# Patient Record
Sex: Female | Born: 1960 | Race: White | Hispanic: No | Marital: Married | Smoking: Former smoker
Health system: Southern US, Community
[De-identification: ages and names within clinical notes are randomized; demographics above are authoritative.]

## PROBLEM LIST (undated history)

## (undated) DIAGNOSIS — M1990 Unspecified osteoarthritis, unspecified site: Secondary | ICD-10-CM

## (undated) DIAGNOSIS — C4490 Unspecified malignant neoplasm of skin, unspecified: Secondary | ICD-10-CM

## (undated) HISTORY — DX: Unspecified malignant neoplasm of skin, unspecified: C44.90

## (undated) HISTORY — PX: ABDOMINAL HYSTERECTOMY: SHX81

## (undated) HISTORY — PX: SKIN CANCER EXCISION: SHX779

## (undated) HISTORY — PX: TONSILLECTOMY: SUR1361

## (undated) HISTORY — DX: Unspecified osteoarthritis, unspecified site: M19.90

## (undated) HISTORY — PX: COLONOSCOPY: SHX174

---

## 2003-03-14 ENCOUNTER — Inpatient Hospital Stay (HOSPITAL_COMMUNITY): Admission: RE | Admit: 2003-03-14 | Discharge: 2003-03-15 | Payer: Self-pay | Admitting: Obstetrics and Gynecology

## 2012-03-30 ENCOUNTER — Encounter: Payer: Self-pay | Admitting: Internal Medicine

## 2012-04-04 ENCOUNTER — Other Ambulatory Visit: Payer: Self-pay | Admitting: Obstetrics and Gynecology

## 2012-05-12 ENCOUNTER — Encounter: Payer: Self-pay | Admitting: Internal Medicine

## 2012-05-12 ENCOUNTER — Ambulatory Visit (AMBULATORY_SURGERY_CENTER): Payer: BC Managed Care – PPO

## 2012-05-12 VITALS — Ht 62.0 in | Wt 174.2 lb

## 2012-05-12 DIAGNOSIS — Z1211 Encounter for screening for malignant neoplasm of colon: Secondary | ICD-10-CM

## 2012-05-12 DIAGNOSIS — Z800 Family history of malignant neoplasm of digestive organs: Secondary | ICD-10-CM

## 2012-05-12 MED ORDER — NA SULFATE-K SULFATE-MG SULF 17.5-3.13-1.6 GM/177ML PO SOLN: 1.0000 | Freq: Once | ORAL | Status: DC

## 2012-05-19 ENCOUNTER — Encounter: Payer: Self-pay | Admitting: Internal Medicine

## 2012-05-26 ENCOUNTER — Encounter: Payer: Self-pay | Admitting: Internal Medicine

## 2012-05-26 ENCOUNTER — Ambulatory Visit (AMBULATORY_SURGERY_CENTER): Payer: BC Managed Care – PPO | Admitting: Internal Medicine

## 2012-05-26 VITALS — BP 117/71 | HR 58 | Temp 97.1°F | Resp 19 | Ht 62.0 in | Wt 174.0 lb

## 2012-05-26 DIAGNOSIS — Z1211 Encounter for screening for malignant neoplasm of colon: Secondary | ICD-10-CM

## 2012-05-26 MED ORDER — SODIUM CHLORIDE 0.9 % IV SOLN: 500.0000 mL | INTRAVENOUS | Status: DC

## 2012-05-26 NOTE — Op Note (Signed)
 [facility] [address] [place], [address]   COLONOSCOPY PROCEDURE REPORT  PATIENT: Katrina Palmer, Katrina Palmer  MR#: [id number] BIRTHDATE: 06/25/1960 , 51  yrs. old GENDER: Female ENDOSCOPIST: Iva Boop, MD, [facility] REFERRED XB:JYNWGN Ross, M.D. PROCEDURE DATE:  05/26/2012 PROCEDURE:   Colonoscopy, screening ASA CLASS:   Class I INDICATIONS:average risk screening. MEDICATIONS: propofol (Diprivan) 200mg  IV  DESCRIPTION OF PROCEDURE:   After the risks benefits and alternatives of the procedure were thoroughly explained, informed consent was obtained.  A digital rectal exam revealed no abnormalities of the rectum.   The LB CF-[id number]  endoscope was introduced through the anus and advanced to the cecum, which was identified by both the appendix and ileocecal valve. No adverse events experienced.   The quality of the prep was Suprep excellent The instrument was then slowly withdrawn as the colon was fully examined.      COLON FINDINGS: A normal appearing cecum, ileocecal valve, and appendiceal orifice were identified.  The ascending, hepatic flexure, transverse, splenic flexure, descending, sigmoid colon and rectum appeared unremarkable.  No polyps or cancers were seen.   A right colon retroflexion was performed.  Retroflexed views revealed no abnormalities. The time to cecum=2 minutes 10 seconds. Withdrawal time=7 minutes 46 seconds.  The scope was withdrawn and the procedure completed. COMPLICATIONS: There were no complications.  ENDOSCOPIC IMPRESSION: Normal colonoscopy, excellent prep  RECOMMENDATIONS: Repeat Colonscopy in 10 years.   eSigned:  Iva Boop, MD, [facility] 05/26/2012 10:13 AM   cc: The Patient  and Waynard Reeds, MD

## 2012-05-26 NOTE — Progress Notes (Addendum)
Patient did not have preoperative order for IV antibiotic SSI prophylaxis. (G8918)  Patient did not experience any of the following events: a burn prior to discharge; a fall within the facility; wrong site/side/patient/procedure/implant event; or a hospital transfer or hospital admission upon discharge from the facility. (G8907)  

## 2012-05-26 NOTE — Patient Instructions (Addendum)
The colonoscopy was normal and the prep was great!  Next routine colonoscopy in 1o years - 2024.  Thank you for choosing me and [facility].  Iva Boop, MD, FACG  YOU HAD AN ENDOSCOPIC PROCEDURE TODAY AT THE [facility]: Refer to the procedure report that was given to you for any specific questions about what was found during the examination.  If the procedure report does not answer your questions, please call your gastroenterologist to clarify.  If you requested that your care partner not be given the details of your procedure findings, then the procedure report has been included in a sealed envelope for you to review at your convenience later.  YOU SHOULD EXPECT: Some feelings of bloating in the abdomen. Passage of more gas than usual.  Walking can help get rid of the air that was put into your GI tract during the procedure and reduce the bloating. If you had a lower endoscopy (such as a colonoscopy or flexible sigmoidoscopy) you may notice spotting of blood in your stool or on the toilet paper. If you underwent a bowel prep for your procedure, then you may not have a normal bowel movement for a few days.  DIET: Your first meal following the procedure should be a light meal and then it is ok to progress to your normal diet.  A half-sandwich or bowl of soup is an example of a good first meal.  Heavy or fried foods are harder to digest and may make you feel nauseous or bloated.  Likewise meals heavy in dairy and vegetables can cause extra gas to form and this can also increase the bloating.  Drink plenty of fluids but you should avoid alcoholic beverages for 24 hours.  ACTIVITY: Your care partner should take you home directly after the procedure.  You should plan to take it easy, moving slowly for the rest of the day.  You can resume normal activity the day after the procedure however you should NOT DRIVE or use heavy machinery for 24 hours (because of the sedation  medicines used during the test).    SYMPTOMS TO REPORT IMMEDIATELY: A gastroenterologist can be reached at any hour.  During normal business hours, 8:30 AM to 5:00 PM Monday through Friday, call [phone].  After hours and on weekends, please call the GI answering service at [phone] who will take a message and have the physician on call contact you.   Following lower endoscopy (colonoscopy or flexible sigmoidoscopy):  Excessive amounts of blood in the stool  Significant tenderness or worsening of abdominal pains  Swelling of the abdomen that is new, acute  Fever of 100F or higher FOLLOW UP: If any biopsies were taken you will be contacted by phone or by letter within the next 1-3 weeks.  Call your gastroenterologist if you have not heard about the biopsies in 3 weeks.  Our staff will call the home number listed on your records the next business day following your procedure to check on you and address any questions or concerns that you may have at that time regarding the information given to you following your procedure. This is a courtesy call and so if there is no answer at the home number and we have not heard from you through the emergency physician on call, we will assume that you have returned to your regular daily activities without incident.  SIGNATURES/CONFIDENTIALITY: You and/or your care partner have signed paperwork which will be entered into your electronic  medical record.  These signatures attest to the fact that that the information above on your After Visit Summary has been reviewed and is understood.  Full responsibility of the confidentiality of this discharge information lies with you and/or your care-partner.

## 2012-05-27 ENCOUNTER — Telehealth: Payer: Self-pay

## 2012-05-27 NOTE — Telephone Encounter (Signed)
Unable to leave message patient away from desk.

## 2013-04-28 ENCOUNTER — Encounter: Payer: BC Managed Care – PPO | Admitting: Cardiology

## 2013-05-02 ENCOUNTER — Ambulatory Visit
Admission: RE | Admit: 2013-05-02 | Discharge: 2013-05-02 | Disposition: A | Discharge status: Home / Self Care | Payer: BC Managed Care – PPO | Source: Ambulatory Visit | Attending: Family Medicine | Admitting: Family Medicine

## 2013-05-02 ENCOUNTER — Other Ambulatory Visit: Payer: Self-pay | Admitting: Family Medicine

## 2013-05-02 DIAGNOSIS — G540 Brachial plexus disorders: Secondary | ICD-10-CM

## 2013-05-12 ENCOUNTER — Encounter: Payer: Self-pay | Admitting: Cardiology

## 2013-06-08 ENCOUNTER — Encounter: Payer: Self-pay | Admitting: Internal Medicine

## 2013-06-08 ENCOUNTER — Ambulatory Visit (INDEPENDENT_AMBULATORY_CARE_PROVIDER_SITE_OTHER): Payer: BC Managed Care – PPO | Admitting: Internal Medicine

## 2013-06-08 VITALS — BP 120/62 | HR 66 | Ht 62.0 in | Wt 166.7 lb

## 2013-06-08 DIAGNOSIS — M79603 Pain in arm, unspecified: Secondary | ICD-10-CM

## 2013-06-08 DIAGNOSIS — R202 Paresthesia of skin: Secondary | ICD-10-CM

## 2013-06-08 DIAGNOSIS — R0789 Other chest pain: Secondary | ICD-10-CM | POA: Insufficient documentation

## 2013-06-08 DIAGNOSIS — M79609 Pain in unspecified limb: Secondary | ICD-10-CM

## 2013-06-08 DIAGNOSIS — E785 Hyperlipidemia, unspecified: Secondary | ICD-10-CM

## 2013-06-08 DIAGNOSIS — R200 Anesthesia of skin: Secondary | ICD-10-CM | POA: Insufficient documentation

## 2013-06-08 DIAGNOSIS — R079 Chest pain, unspecified: Secondary | ICD-10-CM

## 2013-06-08 DIAGNOSIS — R209 Unspecified disturbances of skin sensation: Secondary | ICD-10-CM

## 2013-06-08 NOTE — Progress Notes (Signed)
OFFICE NOTE  Chief Complaint:  Arm numbness and tingling, chest tightness  Primary Care Physician: Katrina Roers, MD  HPI:  Katrina Palmer is a pleasant 53 year old female who is coming referred to me by Dr. Rex Palmer for evaluation of bilateral arm numbness and tingling. She reports her symptoms have been coming on gradually and are somewhat worse in the morning and when doing exercises or activities where she has trace her arms above her head. She does report some decreased grip strength in her hands, which she noted while water skiing. She is somewhat physically active and does exercise classes during the week. She does do some treadmill and aerobic exercises but not of very high metabolic activity. There is a family history of heart disease in her father. She was recently placed on low-dose cholesterol medication for a dyslipidemia. She had genetic testing done as well and I reviewed that today. In fact, her cholesterol profile actually looks fairly good. Her total cholesterol is 200 and direct LDL was 112. HDL was 74 and the HDL ratio was 3. Triglycerides were 85. CRP is low at 0.3 and homocystine is normal. Lipoprotein a was elevated at 60, speaking of a genetic component. Otherwise studies were normal. 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  According to her primary care office visit notes, she was noted to have a diminished pulse when holding her arms over her head. 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 She denies any prior trauma to her neck however has been doing exercises for which she's been carrying weights on her neck and back.  She's never been a significant smoker.  PMHx:  Past Medical History  Diagnosis Date  . Skin cancer     on legs  . Skin cancer     left leg    Past Surgical History  Procedure Laterality Date  . Abdominal hysterectomy    . Tonsillectomy    .  Skin cancer excision      removed from left leg    FAMHx:  Family History  Problem Relation Age of Onset  . Angina Mother   . Heart disease Father   . Diabetes Father   . Stroke Father   . Hypertension Brother   . Colon cancer Maternal Grandmother     SOCHx:   reports that she has quit smoking. She has never used smokeless tobacco. She reports that she drinks about 1.2 ounces of alcohol per week. She reports that she does not use illicit drugs.  ALLERGIES:  No Known Allergies  ROS: A comprehensive review of systems was negative except for: Cardiovascular: positive for chest pressure/discomfort Neurological: positive for paresthesia and weakness  HOME MEDS: Current Outpatient Prescriptions  Medication Sig Dispense Refill  . omega-3 acid ethyl esters (LOVAZA) 1 G capsule Take 1 g by mouth daily.      . rosuvastatin (CRESTOR) 5 MG tablet Take 5 mg by mouth daily.       No current facility-administered medications for this visit.    LABS/IMAGING: No results found for this or any previous visit (from the past 48 hour(s)). No results found.  VITALS: BP 120/62  Pulse 66  Ht 5\' 2"  (1.575 m)  Wt 166 lb 11.2 oz (75.615 kg)  BMI 30.48 kg/m2  EXAM: General appearance: alert and no distress Neck: no carotid bruit and no JVD Lungs: clear to auscultation bilaterally  Heart: regular rate and rhythm, S1, S2 normal, no murmur, click, rub or gallop Abdomen: soft, non-tender; bowel sounds normal; no masses,  no organomegaly Extremities: extremities normal, atraumatic, no cyanosis or edema Pulses: 2+ and symmetric Diminished pulses were not noted with lifting her arms, she has equal blood pressures in both arms Skin: Skin color, texture, turgor normal. No rashes or lesions Neurologic: Alert and oriented X 3, normal strength and tone. Normal symmetric reflexes. Normal coordination and gait Sensory: normal, intact bilateral sensation in arms, negative Phalen's and Tinel's  tests Motor: grossly normal strength 5/5 bilaterally, equal grips Psych: Mood, affect normal  EKG: Normal sinus rhythm at 66  ASSESSMENT: 1. Bilateral arm numbness and tingling-suspect cervical radiculopathy 2. Atypical chest discomfort 3. Mild dyslipidemia  PLAN: 1.   Katrina Palmer is describing discomfort in her neck as well as numbness and tingling in her arms and transient loss of grip strength. Some of her symptoms are worse with extending her neck and reaching overhead. I suspected this represents some cervical radiculopathy. Her symptoms were somewhat worse with axial load she reports numbness and tingling down her left arm wearing her purse over her shoulder. I did not detect any subclavian or cervical bruits. She has equal pulses bilaterally and equal blood pressures bilaterally in her arms. She does report crepitus in the neck which speaks of probable arthritis. Nevertheless, we will check carotid Dopplers for completeness, but as she is not a smoker I think her risk is very low of subclavian or carotid stenosis. She is also describing some atypical chest discomfort which may be the cervical nerves in the upper chest. She did not have significant symptoms when exercising however she does not do very strenuous exercise. Given her family history a plain exercise treadmill stress test is reasonable. Finally, with regard to her lipid profile, I believe she is at very low risk and likely will not need Crestor. I suspect that with 15 pounds of weight loss and increased exercise her cholesterol profile looked pretty much normal.  Thank you as always for the kind referral. I'll be in contact with the results of her testing. If the tests come back negative, I would recommend a referral to a neuro spine specialist. I'm happy to place a referral for her.  Katrina Casino, MD, [facility] Attending Cardiologist [place] 06/08/2013, 9:35 AM

## 2013-06-08 NOTE — Patient Instructions (Signed)
Your physician has requested that you have an exercise tolerance test. For further information please visit [email or web address]. Please also follow instruction sheet, as given.  Your physician has requested that you have a carotid duplex. This test is an ultrasound of the carotid arteries in your neck. It looks at blood flow through these arteries that supply the brain with blood. Allow one hour for this exam. There are no restrictions or special instructions.  Your physician recommends that you schedule a follow-up appointment after your tests.

## 2013-06-22 ENCOUNTER — Telehealth (HOSPITAL_COMMUNITY): Payer: Self-pay

## 2013-06-27 ENCOUNTER — Other Ambulatory Visit: Payer: Self-pay

## 2013-06-27 ENCOUNTER — Ambulatory Visit (HOSPITAL_BASED_OUTPATIENT_CLINIC_OR_DEPARTMENT_OTHER)
Admission: RE | Admit: 2013-06-27 | Discharge: 2013-06-27 | Disposition: A | Discharge status: Home / Self Care | Payer: BC Managed Care – PPO | Source: Ambulatory Visit | Attending: Cardiovascular Disease | Admitting: Cardiovascular Disease

## 2013-06-27 ENCOUNTER — Ambulatory Visit (HOSPITAL_COMMUNITY)
Admission: RE | Admit: 2013-06-27 | Discharge: 2013-06-27 | Disposition: A | Discharge status: Home / Self Care | Payer: BC Managed Care – PPO | Source: Ambulatory Visit | Attending: Cardiovascular Disease | Admitting: Cardiovascular Disease

## 2013-06-27 DIAGNOSIS — R202 Paresthesia of skin: Secondary | ICD-10-CM

## 2013-06-27 DIAGNOSIS — R200 Anesthesia of skin: Secondary | ICD-10-CM

## 2013-06-27 DIAGNOSIS — M79603 Pain in arm, unspecified: Secondary | ICD-10-CM

## 2013-06-27 DIAGNOSIS — R0989 Other specified symptoms and signs involving the circulatory and respiratory systems: Secondary | ICD-10-CM

## 2013-06-27 DIAGNOSIS — R209 Unspecified disturbances of skin sensation: Secondary | ICD-10-CM | POA: Insufficient documentation

## 2013-06-27 DIAGNOSIS — R079 Chest pain, unspecified: Secondary | ICD-10-CM

## 2013-06-27 DIAGNOSIS — M79609 Pain in unspecified limb: Secondary | ICD-10-CM | POA: Insufficient documentation

## 2013-06-27 NOTE — Progress Notes (Signed)
Carotid Duplex Completed. Eartha Vonbehren, BS, RDMS, RVT  

## 2013-06-28 ENCOUNTER — Telehealth: Payer: Self-pay | Admitting: *Deleted

## 2013-06-28 NOTE — Telephone Encounter (Signed)
Left message with husband about normal exercise tolerance test results. Marlou Sa, husband, will communicate this with wife.

## 2013-07-04 NOTE — Progress Notes (Signed)
LMTCB

## 2013-07-05 ENCOUNTER — Telehealth: Payer: Self-pay | Admitting: *Deleted

## 2013-07-05 NOTE — Telephone Encounter (Signed)
Patient notified of carotid doppler results.  

## 2013-07-05 NOTE — Telephone Encounter (Signed)
Pt was calling in regards to her test results.  [place]

## 2013-07-18 ENCOUNTER — Encounter: Payer: Self-pay | Admitting: Internal Medicine

## 2013-07-18 ENCOUNTER — Ambulatory Visit (INDEPENDENT_AMBULATORY_CARE_PROVIDER_SITE_OTHER): Payer: BC Managed Care – PPO | Admitting: Internal Medicine

## 2013-07-18 VITALS — BP 116/73 | HR 58 | Ht 62.0 in | Wt 162.6 lb

## 2013-07-18 DIAGNOSIS — R202 Paresthesia of skin: Principal | ICD-10-CM

## 2013-07-18 DIAGNOSIS — R200 Anesthesia of skin: Secondary | ICD-10-CM

## 2013-07-18 DIAGNOSIS — E785 Hyperlipidemia, unspecified: Secondary | ICD-10-CM

## 2013-07-18 DIAGNOSIS — R0789 Other chest pain: Secondary | ICD-10-CM

## 2013-07-18 DIAGNOSIS — R209 Unspecified disturbances of skin sensation: Secondary | ICD-10-CM

## 2013-07-18 MED ORDER — ROSUVASTATIN CALCIUM 5 MG PO TABS
5.0000 mg | ORAL_TABLET | Freq: Every day | ORAL | Status: DC
Start: 2013-07-18 — End: 2015-10-02

## 2013-07-18 NOTE — Patient Instructions (Signed)
Your physician recommends that you schedule a follow-up appointment as needed  

## 2013-07-18 NOTE — Progress Notes (Signed)
OFFICE NOTE  Chief Complaint:  Arm numbness and tingling, chest tightness  Primary Care Physician: Katrina Roers, MD  HPI:  Katrina Palmer is a pleasant 53 year old female who is coming referred to me by Dr. Rex Palmer for evaluation of bilateral arm numbness and tingling. She reports her symptoms have been coming on gradually and are somewhat worse in the morning and when doing exercises or activities where she has trace her arms above her head. She does report some decreased grip strength in her hands, which she noted while water skiing. She is somewhat physically active and does exercise classes during the week. She does do some treadmill and aerobic exercises but not of very high metabolic activity. There is a family history of heart disease in her father. She was recently placed on low-dose cholesterol medication for a dyslipidemia. She had genetic testing done as well and I reviewed that today. In fact, her cholesterol profile actually looks fairly good. Her total cholesterol is 200 and direct LDL was 112. HDL was 74 and the HDL ratio was 3. Triglycerides were 85. CRP is low at 0.3 and homocystine is normal. Lipoprotein a was elevated at 60, speaking of a genetic component. Otherwise studies were normal. 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  According to her primary care office visit notes, she was noted to have a diminished pulse when holding her arms over her head. 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 She denies any prior trauma to her neck however has been doing exercises for which she's been carrying weights on her neck and back.  She's never been a significant smoker.  Katrina Palmer returns today for followup of her stress test and carotid Dopplers. The carotid Doppler did not demonstrate any carotid plaque. Her exercise treadmill stress test was negative for ischemia and she  exercised for over 12 minutes and 13 metabolic equivalents. Her Duke treadmill score is +12.  This is associated with a very favorable prognosis.  PMHx:  Past Medical History  Diagnosis Date  . Skin cancer     on legs  . Skin cancer     left leg    Past Surgical History  Procedure Laterality Date  . Abdominal hysterectomy    . Tonsillectomy    . Skin cancer excision      removed from left leg    FAMHx:  Family History  Problem Relation Age of Onset  . Angina Mother   . Heart disease Father   . Diabetes Father   . Stroke Father   . Hypertension Brother   . Colon cancer Maternal Grandmother     SOCHx:   reports that she has quit smoking. She has never used smokeless tobacco. She reports that she drinks about 1.2 ounces of alcohol per week. She reports that she does not use illicit drugs.  ALLERGIES:  No Known Allergies  ROS: A comprehensive review of systems was negative except for: Cardiovascular: positive for chest pressure/discomfort Neurological: positive for paresthesia and weakness  HOME MEDS: Current Outpatient Prescriptions  Medication Sig Dispense Refill  . omega-3 acid ethyl esters (LOVAZA) 1 G capsule Take 1 g by mouth daily.      . rosuvastatin (CRESTOR) 5 MG tablet Take 1 tablet (5 mg total) by mouth daily.  28 tablet  0   No current facility-administered medications for this visit.  LABS/IMAGING: No results found for this or any previous visit (from the past 48 hour(s)). No results found.  VITALS: BP 116/73  Pulse 58  Ht 5\' 2"  (1.575 m)  Wt 162 lb 9.6 oz (73.755 kg)  BMI 29.73 kg/m2  EXAM: deferred  EKG: deferred  ASSESSMENT: 1. Bilateral arm numbness and tingling-suspect cervical radiculopathy  2. Atypical chest discomfort - negative Bruce Treadmill stress test 3. Mild dyslipidemia  PLAN: 1.   [name] had a low-risk stress test with good exercise performance. This is highly predictive of low risk of cardiac events. Her carotid  Dopplers demonstrated no plaque. It is certainly reasonable to continue her on low-dose Crestor with a goal LDL of around 110. Overall her genetic markers were very favorable arguing against risk for developing atherosclerosis.  She continues to have some pains from her neck which could be a cervical radiculopathy. It may be worthwhile obtaining plain films of the neck or considering a spine evaluation. She is not interested in further workup at this time for that.  Thank you as always for the kind referral. She can follow up with me on an as-needed basis.  Pixie Casino, MD, [facility] Attending Cardiologist [place] 07/18/2013, 11:59 AM

## 2013-09-13 NOTE — Telephone Encounter (Signed)
Encounter complete. 

## 2014-08-24 ENCOUNTER — Ambulatory Visit: Payer: Self-pay | Admitting: Podiatry

## 2014-08-31 ENCOUNTER — Ambulatory Visit: Payer: Self-pay | Admitting: Podiatry

## 2015-02-18 LAB — HM PAP SMEAR: HM Pap smear: NEGATIVE

## 2015-08-01 DIAGNOSIS — C4490 Unspecified malignant neoplasm of skin, unspecified: Secondary | ICD-10-CM

## 2015-08-01 HISTORY — DX: Unspecified malignant neoplasm of skin, unspecified: C44.90

## 2015-09-02 ENCOUNTER — Telehealth: Payer: Self-pay | Admitting: Oncology

## 2015-09-02 NOTE — Telephone Encounter (Signed)
Pt appt to see Dr. Barry Dienes is 09/16/15 @ 9:15.  Pt is aware

## 2015-09-16 ENCOUNTER — Other Ambulatory Visit: Payer: Self-pay | Admitting: General Surgery

## 2015-09-16 DIAGNOSIS — C4359 Malignant melanoma of other part of trunk: Secondary | ICD-10-CM

## 2015-09-16 DIAGNOSIS — C439 Malignant melanoma of skin, unspecified: Secondary | ICD-10-CM

## 2015-10-07 ENCOUNTER — Encounter (HOSPITAL_COMMUNITY): Payer: Self-pay

## 2015-10-07 NOTE — Pre-Procedure Instructions (Signed)
Katrina Palmer  10/07/2015      [facility] - [place] - [address] [place] [address] Phone: [phone] Fax: [phone]  [place], [place] [address] Phone: [phone] Fax: [phone]    Your procedure is scheduled on Tuesday, August 15.  Report to [facility] Admitting at 6:30 A.M.  Call this number if you have problems the morning of surgery:  [phone]   Remember:  Do not eat food or drink liquids after midnight.  Take these medicines the morning of surgery with A SIP OF WATER: NONE  7 days prior to surgery STOP taking any Aspirin, Aleve, Naproxen, Ibuprofen, Motrin, Advil, Goody's, BC's, all herbal medications, fish oil, and all vitamins    Do not wear jewelry, make-up or nail polish.  Do not wear lotions, powders, or perfumes.  You may NOT wear deoderant.  Do not shave 48 hours prior to surgery.   Do not bring valuables to the hospital.  [facility] is not responsible for any belongings or valuables.  Contacts, dentures or bridgework may not be worn into surgery.  Leave your suitcase in the car.  After surgery it may be brought to your room.  For patients admitted to the hospital, discharge time will be determined by your treatment team.  Patients discharged the day of surgery will not be allowed to drive home.    Special instructions:     [facility]- Preparing For Surgery  Before surgery, you can play an important role. Because skin is not sterile, your skin needs to be as free of germs as possible. You can reduce the number of germs on your skin by washing with CHG (chlorahexidine gluconate) Soap before surgery.  CHG is an antiseptic cleaner which kills germs and bonds with the skin to continue killing germs even after washing.  Please do not use if you have an allergy to CHG or antibacterial  soaps. If your skin becomes reddened/irritated stop using the CHG.  Do not shave (including legs and underarms) for at least 48 hours prior to first CHG shower. It is OK to shave your face.  Please follow these instructions carefully.   1. Shower the NIGHT BEFORE SURGERY and the MORNING OF SURGERY with CHG.   2. If you chose to wash your hair, wash your hair first as usual with your normal shampoo.  3. After you shampoo, rinse your hair and body thoroughly to remove the shampoo.  4. Use CHG as you would any other liquid soap. You can apply CHG directly to the skin and wash gently with a scrungie or a clean washcloth.   5. Apply the CHG Soap to your body ONLY FROM THE NECK DOWN.  Do not use on open wounds or open sores. Avoid contact with your eyes, ears, mouth and genitals (private parts). Wash genitals (private parts) with your normal soap.  6. Wash thoroughly, paying special attention to the area where your surgery will be performed.  7. Thoroughly rinse your body with warm water from the neck down.  8. DO NOT shower/wash with your normal soap after using and rinsing off the CHG Soap.  9. Pat yourself dry with a CLEAN TOWEL.   10. Wear CLEAN PAJAMAS   11. Place CLEAN SHEETS on your bed the night of your first shower and DO NOT SLEEP WITH  PETS.    Day of Surgery: Do not apply any deodorants/lotions. Please wear clean clothes to the hospital/surgery center.      Please read over the following fact sheets that you were given. Pain Booklet, Coughing and Deep Breathing and Surgical Site Infection Prevention

## 2015-10-08 ENCOUNTER — Encounter (HOSPITAL_COMMUNITY): Payer: Self-pay

## 2015-10-08 ENCOUNTER — Encounter (HOSPITAL_COMMUNITY)
Admission: RE | Admit: 2015-10-08 | Discharge: 2015-10-08 | Disposition: A | Discharge status: Home / Self Care | Payer: BLUE CROSS/BLUE SHIELD | Source: Ambulatory Visit | Attending: General Surgery | Admitting: General Surgery

## 2015-10-08 ENCOUNTER — Ambulatory Visit (HOSPITAL_COMMUNITY)
Admission: RE | Admit: 2015-10-08 | Discharge: 2015-10-08 | Disposition: A | Discharge status: Home / Self Care | Payer: BLUE CROSS/BLUE SHIELD | Source: Ambulatory Visit | Attending: General Surgery | Admitting: General Surgery

## 2015-10-08 DIAGNOSIS — Z01818 Encounter for other preprocedural examination: Secondary | ICD-10-CM | POA: Insufficient documentation

## 2015-10-08 DIAGNOSIS — C4362 Malignant melanoma of left upper limb, including shoulder: Secondary | ICD-10-CM | POA: Diagnosis not present

## 2015-10-08 DIAGNOSIS — Z01812 Encounter for preprocedural laboratory examination: Secondary | ICD-10-CM | POA: Insufficient documentation

## 2015-10-08 LAB — CBC
HCT: 41.6 % (ref 36.0–46.0)
HEMOGLOBIN: 13.8 g/dL (ref 12.0–15.0)
MCH: 30.8 pg (ref 26.0–34.0)
MCHC: 33.2 g/dL (ref 30.0–36.0)
MCV: 92.9 fL (ref 78.0–100.0)
Platelets: 262 10*3/uL (ref 150–400)
RBC: 4.48 MIL/uL (ref 3.87–5.11)
RDW: 12.8 % (ref 11.5–15.5)
WBC: 5.2 10*3/uL (ref 4.0–10.5)

## 2015-10-08 LAB — BASIC METABOLIC PANEL
ANION GAP: 10 (ref 5–15)
BUN: 12 mg/dL (ref 6–20)
CALCIUM: 9.5 mg/dL (ref 8.9–10.3)
CO2: 24 mmol/L (ref 22–32)
CREATININE: 0.67 mg/dL (ref 0.44–1.00)
Chloride: 104 mmol/L (ref 101–111)
GFR calc Af Amer: >60 mL/min (ref >60)
GFR calc non Af Amer: >60 mL/min (ref >60)
GLUCOSE: 93 mg/dL (ref 65–99)
Potassium: 3.7 mmol/L (ref 3.5–5.1)
Sodium: 138 mmol/L (ref 135–145)

## 2015-10-08 MED ORDER — TECHNETIUM TC 99M SULFUR COLLOID FILTERED
0.5000 | Freq: Once | INTRAVENOUS | Status: AC | PRN
  Administered 2015-10-08: 0.5 via INTRADERMAL

## 2015-10-08 NOTE — Progress Notes (Signed)
PCP - Tamsen Roers Cardiologist - denies  Chest x-ray - not needed EKG - 2015  - EPIC Stress Test - 06/19/13 ECHO - denies Cardiac Cath - denies    Patient denies shortness of breath, fever, cough and chest pain at PAT appointment

## 2015-10-10 NOTE — H&P (Signed)
Katrina Palmer 09/16/2015 9:26 AM Location: [facility] Patient #: [id number] DOB: 12-09-1960 Married / Language: Cleophus Molt / Race: White Female   History of Present Illness Stark Klein MD; 09/16/2015 10:49 AM) The patient is a 55 year old female who presents for a melanoma biopsy follow-up. Patient is 55 year old female referred by Dr. Jarome Matin for consultation regarding a new malignant melanoma. The patient has a history of sun exposure and a history of basal cell cancers. She had a new pigmented lesion that was biopsied and was positive for superficial spreading malignant melanoma. This is at least 0.9 millimeters deep. Ulceration was absent. There were fewer than 1 mitoses per millimeter square. The lesion extended to peripheral and deep margins. Regression was absent and tumor infiltrating lymphocytes were present. LVI was negative. Patient has a positive family history of melanoma in her father.   Other Problems Elbert Ewings, CMA; 09/16/2015 9:27 AM) No pertinent past medical history  Past Surgical History Elbert Ewings, CMA; 09/16/2015 9:27 AM) Hysterectomy (not due to cancer) - Complete Oral Surgery Tonsillectomy  Diagnostic Studies History Elbert Ewings, CMA; 09/16/2015 9:27 AM) Colonoscopy 1-5 years ago Mammogram within last year Pap Smear 1-5 years ago  Allergies Elbert Ewings, CMA; 09/16/2015 9:27 AM) No Known Drug Allergies07/17/2017  Medication History Elbert Ewings, CMA; 09/16/2015 9:27 AM) No Current Medications Medications Reconciled  Social History Elbert Ewings, CMA; 09/16/2015 9:27 AM) Alcohol use Moderate alcohol use. Caffeine use Coffee. No drug use Tobacco use Former smoker.  Family History Elbert Ewings, [place]; 09/16/2015 9:27 AM) Alcohol Abuse Father. Diabetes Mellitus Father. Heart Disease Father. Heart disease in female family member before age 43 Melanoma Father. Thyroid problems Mother.  Pregnancy / Birth History  Elbert Ewings, CMA; 09/16/2015 9:27 AM) Age at menarche 47 years. Age of menopause <43 Contraceptive History Oral contraceptives. Gravida 5 Maternal age 60-30 Para 2    Review of Systems Elbert Ewings CMA; 09/16/2015 9:27 AM) General Present- Night Sweats. Not Present- Appetite Loss, Chills, Fatigue, Fever, Weight Gain and Weight Loss. Skin Present- Change in Wart/Mole. Not Present- Dryness, Hives, Jaundice, New Lesions, Non-Healing Wounds, Rash and Ulcer. HEENT Present- Seasonal Allergies. Not Present- Earache, Hearing Loss, Hoarseness, Nose Bleed, Oral Ulcers, Ringing in the Ears, Sinus Pain, Sore Throat, Visual Disturbances, Wears glasses/contact lenses and Yellow Eyes. Respiratory Not Present- Bloody sputum, Chronic Cough, Difficulty Breathing, Snoring and Wheezing. Breast Not Present- Breast Mass, Breast Pain, Nipple Discharge and Skin Changes. Cardiovascular Not Present- Chest Pain, Difficulty Breathing Lying Down, Leg Cramps, Palpitations, Rapid Heart Rate, Shortness of Breath and Swelling of Extremities. Gastrointestinal Not Present- Abdominal Pain, Bloating, Bloody Stool, Change in Bowel Habits, Chronic diarrhea, Constipation, Difficulty Swallowing, Excessive gas, Gets full quickly at meals, Hemorrhoids, Indigestion, Nausea, Rectal Pain and Vomiting. Female Genitourinary Present- Frequency and Urgency. Not Present- Nocturia, Painful Urination and Pelvic Pain. Musculoskeletal Present- Muscle Pain. Not Present- Back Pain, Joint Pain, Joint Stiffness, Muscle Weakness and Swelling of Extremities. Neurological Present- Numbness. Not Present- Decreased Memory, Fainting, Headaches, Seizures, Tingling, Tremor, Trouble walking and Weakness. Psychiatric Present- Change in Sleep Pattern. Not Present- Anxiety, Bipolar, Depression, Fearful and Frequent crying. Endocrine Present- Hot flashes. Not Present- Cold Intolerance, Excessive Hunger, Hair Changes, Heat Intolerance and New  Diabetes. Hematology Not Present- Blood Thinners, Easy Bruising, Excessive bleeding, Gland problems, HIV and Persistent Infections.  Vitals Elbert Ewings CMA; 09/16/2015 9:27 AM) 09/16/2015 9:27 AM Weight: 175 lb Height: 62in Body Surface Area: 1.81 m Body Mass Index: 32.01 kg/m  Temp.: 97.34F(Temporal)  Pulse: 80 (  Regular)  BP: 124/74 (Sitting, Left Arm, Standard)       Physical Exam Stark Klein MD; 09/16/2015 10:49 AM) General Mental Status-Alert. General Appearance-Consistent with stated age. Hydration-Well hydrated. Voice-Normal.  Integumentary Note: healed over shave site upper middle back. slightly to left.   Head and Neck Head-normocephalic, atraumatic with no lesions or palpable masses.  Eye Sclera/Conjunctiva - Bilateral-No scleral icterus.  Chest and Lung Exam Chest and lung exam reveals -quiet, even and easy respiratory effort with no use of accessory muscles. Inspection Chest Wall - Normal. Back - normal.  Breast - Did not examine.  Cardiovascular Cardiovascular examination reveals -normal pedal pulses bilaterally. Note: regular rate and rhythm  Abdomen Inspection-Inspection Normal. Palpation/Percussion Palpation and Percussion of the abdomen reveal - Soft, Non Tender, No Rebound tenderness, No Rigidity (guarding) and No hepatosplenomegaly.  Peripheral Vascular Upper Extremity Inspection - Bilateral - Normal - No Clubbing, No Cyanosis, No Edema, Pulses Intact. Lower Extremity Palpation - Edema - Bilateral - No edema.  Neurologic Neurologic evaluation reveals -alert and oriented x 3 with no impairment of recent or remote memory. Mental Status-Normal.  Musculoskeletal Global Assessment -Note: no gross deformities.  Normal Exam - Left-Upper Extremity Strength Normal and Lower Extremity Strength Normal. Normal Exam - Right-Upper Extremity Strength Normal and Lower Extremity Strength  Normal.  Lymphatic Head & Neck  General Head & Neck Lymphatics: Bilateral - Description - Normal. Axillary  General Axillary Region: Bilateral - Description - Normal. Tenderness - Non Tender.    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 Because of the positive deep margin, I will plan to do sentinel lymph node mapping and biopsy. I reviewed with local excision with advancement flap closure. I discussed the normal lymph node mapping and the process by which this occurs. I discussed risks of surgery including bleeding, infection, damage to adjacent structures, numbness, or risk of recurrence, risk of additional procedures or surgeries.  I discussed postop restrictions such as no swimming for 2 weeks. The patient does go to awake every weekend. I also discussed no lifting. I reviewed time off work.  The patient and her husband agree to proceed.  I discussed that we would need lymphoscintogram pre operatively in order to make sure we just needed to do biopsy in 1 axilla. Current Plans Pt Education - Melanoma: melanoma You are being scheduled for surgery - Our schedulers will call you.  You should hear from our office's scheduling department within 5 working days about the location, date, and time of surgery. We try to make accommodations for patient's preferences in scheduling surgery, but sometimes the OR schedule or the surgeon's schedule prevents [place] from making those accommodations.  If you have not heard from our office [phone]) in 5 working days, call the office and ask for your surgeon's nurse.  If you have other questions about your diagnosis, plan, or surgery, call the office and ask for your surgeon's nurse.

## 2015-10-14 MED ORDER — CEFAZOLIN SODIUM-DEXTROSE 2-4 GM/100ML-% IV SOLN
2.0000 g | INTRAVENOUS | Status: AC
  Administered 2015-10-15: 2 g via INTRAVENOUS
  Filled 2015-10-14: qty 100

## 2015-10-14 NOTE — Anesthesia Preprocedure Evaluation (Addendum)
Anesthesia Evaluation  Patient identified by MRN, date of birth, ID band Patient awake    History of Anesthesia Complications Negative for: history of anesthetic complications  Airway Mallampati: I  TM Distance: >3 FB Neck ROM: Full    Dental  (+) Teeth Intact, Dental Advisory Given   Pulmonary neg pulmonary ROS, former smoker,    breath sounds clear to auscultation       Cardiovascular negative cardio ROS   Rhythm:Regular Rate:Normal     Neuro/Psych negative neurological ROS     GI/Hepatic negative GI ROS, Neg liver ROS,   Endo/Other  negative endocrine ROS  Renal/GU negative Renal ROS     Musculoskeletal   Abdominal   Peds  Hematology negative hematology ROS (+)   Anesthesia Other Findings   Reproductive/Obstetrics                           Anesthesia Physical Anesthesia Plan  ASA: I  Anesthesia Plan: General   Post-op Pain Management:    Induction: Intravenous  Airway Management Planned: Oral ETT  Additional Equipment:   Intra-op Plan:   Post-operative Plan: Extubation in OR  Informed Consent: I have reviewed the patients History and Physical, chart, labs and discussed the procedure including the risks, benefits and alternatives for the proposed anesthesia with the patient or authorized representative who has indicated his/her understanding and acceptance.     Plan Discussed with: CRNA  Anesthesia Plan Comments:         Anesthesia Quick Evaluation

## 2015-10-15 ENCOUNTER — Ambulatory Visit (HOSPITAL_COMMUNITY)
Admission: RE | Admit: 2015-10-15 | Discharge: 2015-10-15 | Disposition: A | Discharge status: Home / Self Care | Payer: BLUE CROSS/BLUE SHIELD | Source: Ambulatory Visit | Attending: General Surgery | Admitting: General Surgery

## 2015-10-15 ENCOUNTER — Ambulatory Visit (HOSPITAL_COMMUNITY): Payer: BLUE CROSS/BLUE SHIELD | Admitting: Certified Registered"

## 2015-10-15 ENCOUNTER — Encounter (HOSPITAL_COMMUNITY): Payer: Self-pay | Admitting: Certified Registered Nurse Anesthetist

## 2015-10-15 ENCOUNTER — Encounter (HOSPITAL_COMMUNITY)
Admission: RE | Disposition: A | Discharge status: Home / Self Care | Payer: Self-pay | Source: Ambulatory Visit | Attending: General Surgery

## 2015-10-15 DIAGNOSIS — C4359 Malignant melanoma of other part of trunk: Secondary | ICD-10-CM | POA: Insufficient documentation

## 2015-10-15 DIAGNOSIS — Z90710 Acquired absence of both cervix and uterus: Secondary | ICD-10-CM | POA: Diagnosis not present

## 2015-10-15 DIAGNOSIS — Z87891 Personal history of nicotine dependence: Secondary | ICD-10-CM | POA: Diagnosis not present

## 2015-10-15 HISTORY — PX: SENTINEL NODE BIOPSY: SHX6608

## 2015-10-15 HISTORY — PX: MELANOMA EXCISION WITH SENTINEL LYMPH NODE BIOPSY: SHX5267

## 2015-10-15 SURGERY — MELANOMA EXCISION WITH SENTINEL LYMPH NODE BIOPSY
Anesthesia: General | Site: Breast

## 2015-10-15 MED ORDER — OXYCODONE HCL 5 MG PO TABS
5.0000 mg | ORAL_TABLET | ORAL | Status: DC | PRN
  Administered 2015-10-15: 5 mg via ORAL

## 2015-10-15 MED ORDER — OXYCODONE HCL 5 MG PO TABS: 5.0000 mg | ORAL_TABLET | Freq: Four times a day (QID) | ORAL | 0 refills | Status: DC | PRN

## 2015-10-15 MED ORDER — LIDOCAINE 2% (20 MG/ML) 5 ML SYRINGE
INTRAMUSCULAR | Status: AC
  Filled 2015-10-15: qty 10

## 2015-10-15 MED ORDER — FENTANYL CITRATE (PF) 100 MCG/2ML IJ SOLN
INTRAMUSCULAR | Status: DC | PRN
  Administered 2015-10-15: 100 ug via INTRAVENOUS
  Administered 2015-10-15: 25 ug via INTRAVENOUS
  Administered 2015-10-15: 50 ug via INTRAVENOUS

## 2015-10-15 MED ORDER — CHLORHEXIDINE GLUCONATE CLOTH 2 % EX PADS: 6.0000 | MEDICATED_PAD | Freq: Once | CUTANEOUS | Status: DC

## 2015-10-15 MED ORDER — SUCCINYLCHOLINE CHLORIDE 200 MG/10ML IV SOSY
PREFILLED_SYRINGE | INTRAVENOUS | Status: AC
  Filled 2015-10-15: qty 10

## 2015-10-15 MED ORDER — LIDOCAINE 2% (20 MG/ML) 5 ML SYRINGE
INTRAMUSCULAR | Status: AC
  Filled 2015-10-15: qty 5

## 2015-10-15 MED ORDER — ONDANSETRON HCL 4 MG/2ML IJ SOLN
INTRAMUSCULAR | Status: DC | PRN
  Administered 2015-10-15 (×2): 4 mg via INTRAVENOUS

## 2015-10-15 MED ORDER — MIDAZOLAM HCL 5 MG/5ML IJ SOLN
INTRAMUSCULAR | Status: DC | PRN
  Administered 2015-10-15: 2 mg via INTRAVENOUS

## 2015-10-15 MED ORDER — TECHNETIUM TC 99M SULFUR COLLOID FILTERED
0.5000 | Freq: Once | INTRAVENOUS | Status: AC | PRN
  Administered 2015-10-15: 0.5 via INTRADERMAL

## 2015-10-15 MED ORDER — LIDOCAINE HCL (PF) 1 % IJ SOLN
INTRAMUSCULAR | Status: AC
  Filled 2015-10-15: qty 30

## 2015-10-15 MED ORDER — LIDOCAINE 2% (20 MG/ML) 5 ML SYRINGE
INTRAMUSCULAR | Status: DC | PRN
  Administered 2015-10-15: 50 mg via INTRAVENOUS

## 2015-10-15 MED ORDER — ROCURONIUM BROMIDE 10 MG/ML (PF) SYRINGE
PREFILLED_SYRINGE | INTRAVENOUS | Status: DC | PRN
  Administered 2015-10-15: 10 mg via INTRAVENOUS
  Administered 2015-10-15: 50 mg via INTRAVENOUS

## 2015-10-15 MED ORDER — LIDOCAINE HCL 1 % IJ SOLN
INTRAMUSCULAR | Status: DC | PRN
  Administered 2015-10-15: 09:00:00

## 2015-10-15 MED ORDER — PROPOFOL 10 MG/ML IV BOLUS
INTRAVENOUS | Status: AC
Start: 2015-10-15 — End: 2015-10-15
  Filled 2015-10-15: qty 20

## 2015-10-15 MED ORDER — METHYLENE BLUE 0.5 % INJ SOLN
INTRAVENOUS | Status: DC | PRN
  Administered 2015-10-15: 10 mL via SUBMUCOSAL

## 2015-10-15 MED ORDER — EPHEDRINE SULFATE 50 MG/ML IJ SOLN
INTRAMUSCULAR | Status: DC | PRN
  Administered 2015-10-15: 5 mg via INTRAVENOUS

## 2015-10-15 MED ORDER — BUPIVACAINE-EPINEPHRINE (PF) 0.25% -1:200000 IJ SOLN
INTRAMUSCULAR | Status: AC
  Filled 2015-10-15: qty 30

## 2015-10-15 MED ORDER — HYDROMORPHONE HCL 1 MG/ML IJ SOLN: 0.2500 mg | INTRAMUSCULAR | Status: DC | PRN

## 2015-10-15 MED ORDER — ONDANSETRON HCL 4 MG/2ML IJ SOLN
INTRAMUSCULAR | Status: AC
  Filled 2015-10-15: qty 2

## 2015-10-15 MED ORDER — PHENYLEPHRINE 40 MCG/ML (10ML) SYRINGE FOR IV PUSH (FOR BLOOD PRESSURE SUPPORT)
PREFILLED_SYRINGE | INTRAVENOUS | Status: DC | PRN
  Administered 2015-10-15: 80 ug via INTRAVENOUS
  Administered 2015-10-15 (×2): 40 ug via INTRAVENOUS

## 2015-10-15 MED ORDER — METHYLENE BLUE 0.5 % INJ SOLN
INTRAVENOUS | Status: AC
  Filled 2015-10-15: qty 10

## 2015-10-15 MED ORDER — ROCURONIUM BROMIDE 10 MG/ML (PF) SYRINGE
PREFILLED_SYRINGE | INTRAVENOUS | Status: AC
  Filled 2015-10-15: qty 10

## 2015-10-15 MED ORDER — 0.9 % SODIUM CHLORIDE (POUR BTL) OPTIME
TOPICAL | Status: DC | PRN
  Administered 2015-10-15: 1000 mL

## 2015-10-15 MED ORDER — DEXAMETHASONE SODIUM PHOSPHATE 10 MG/ML IJ SOLN
INTRAMUSCULAR | Status: DC | PRN
  Administered 2015-10-15: 10 mg via INTRAVENOUS

## 2015-10-15 MED ORDER — LACTATED RINGERS IV SOLN
INTRAVENOUS | Status: DC
  Administered 2015-10-15: 50 mL/h via INTRAVENOUS
  Administered 2015-10-15: 10:00:00 via INTRAVENOUS

## 2015-10-15 MED ORDER — ONDANSETRON HCL 4 MG/2ML IJ SOLN
INTRAMUSCULAR | Status: AC
  Filled 2015-10-15: qty 4

## 2015-10-15 MED ORDER — MIDAZOLAM HCL 2 MG/2ML IJ SOLN
INTRAMUSCULAR | Status: AC
  Filled 2015-10-15: qty 2

## 2015-10-15 MED ORDER — SUGAMMADEX SODIUM 500 MG/5ML IV SOLN
INTRAVENOUS | Status: AC
  Filled 2015-10-15: qty 5

## 2015-10-15 MED ORDER — OXYCODONE HCL 5 MG PO TABS
ORAL_TABLET | ORAL | Status: AC
  Filled 2015-10-15: qty 1

## 2015-10-15 MED ORDER — PROMETHAZINE HCL 25 MG/ML IJ SOLN: 6.2500 mg | INTRAMUSCULAR | Status: DC | PRN

## 2015-10-15 MED ORDER — SUGAMMADEX SODIUM 200 MG/2ML IV SOLN
INTRAVENOUS | Status: DC | PRN
  Administered 2015-10-15: 150 mg via INTRAVENOUS

## 2015-10-15 MED ORDER — SUGAMMADEX SODIUM 200 MG/2ML IV SOLN
INTRAVENOUS | Status: AC
  Filled 2015-10-15: qty 2

## 2015-10-15 MED ORDER — PHENYLEPHRINE 40 MCG/ML (10ML) SYRINGE FOR IV PUSH (FOR BLOOD PRESSURE SUPPORT)
PREFILLED_SYRINGE | INTRAVENOUS | Status: AC
  Filled 2015-10-15: qty 10

## 2015-10-15 MED ORDER — PHENYLEPHRINE HCL 10 MG/ML IJ SOLN
INTRAMUSCULAR | Status: DC | PRN
  Administered 2015-10-15: 15 ug/min via INTRAVENOUS

## 2015-10-15 MED ORDER — PROPOFOL 10 MG/ML IV BOLUS
INTRAVENOUS | Status: AC
  Filled 2015-10-15: qty 20

## 2015-10-15 MED ORDER — FENTANYL CITRATE (PF) 250 MCG/5ML IJ SOLN
INTRAMUSCULAR | Status: AC
  Filled 2015-10-15: qty 5

## 2015-10-15 MED ORDER — PROPOFOL 10 MG/ML IV BOLUS
INTRAVENOUS | Status: DC | PRN
  Administered 2015-10-15: 150 mg via INTRAVENOUS
  Administered 2015-10-15: 50 mg via INTRAVENOUS

## 2015-10-15 MED ORDER — DEXAMETHASONE SODIUM PHOSPHATE 10 MG/ML IJ SOLN
INTRAMUSCULAR | Status: AC
  Filled 2015-10-15: qty 1

## 2015-10-15 SURGICAL SUPPLY — 63 items
BENZOIN TINCTURE PRP APPL 2/3 (GAUZE/BANDAGES/DRESSINGS) ×4 IMPLANT
BNDG COHESIVE 4X5 TAN STRL (GAUZE/BANDAGES/DRESSINGS) IMPLANT
BNDG GAUZE ELAST 4 BULKY (GAUZE/BANDAGES/DRESSINGS) IMPLANT
CANISTER SUCTION 2500CC (MISCELLANEOUS) ×4 IMPLANT
CHLORAPREP W/TINT 26ML (MISCELLANEOUS) ×8 IMPLANT
CLIP TI MEDIUM 24 (CLIP) ×4 IMPLANT
CLIP TI WIDE RED SMALL 24 (CLIP) ×4 IMPLANT
CLOSURE WOUND 1/2 X4 (GAUZE/BANDAGES/DRESSINGS) ×1
CONT SPEC 4OZ CLIKSEAL STRL BL (MISCELLANEOUS) ×4 IMPLANT
COVER MAYO STAND STRL (DRAPES) IMPLANT
COVER PROBE W GEL 5X96 (DRAPES) ×4 IMPLANT
COVER SURGICAL LIGHT HANDLE (MISCELLANEOUS) ×8 IMPLANT
DECANTER SPIKE VIAL GLASS SM (MISCELLANEOUS) IMPLANT
DERMABOND ADVANCED (GAUZE/BANDAGES/DRESSINGS) ×2
DERMABOND ADVANCED .7 DNX12 (GAUZE/BANDAGES/DRESSINGS) ×2 IMPLANT
DRAPE LAPAROSCOPIC ABDOMINAL (DRAPES) ×8 IMPLANT
DRAPE PROXIMA HALF (DRAPES) IMPLANT
DRAPE UNIVERSAL PACK (DRAPES) IMPLANT
DRAPE UTILITY XL STRL (DRAPES) ×4 IMPLANT
DRSG TEGADERM 4X4.75 (GAUZE/BANDAGES/DRESSINGS) ×4 IMPLANT
ELECT REM PT RETURN 9FT ADLT (ELECTROSURGICAL) ×4
ELECTRODE REM PT RTRN 9FT ADLT (ELECTROSURGICAL) ×2 IMPLANT
GAUZE SPONGE 2X2 8PLY STRL LF (GAUZE/BANDAGES/DRESSINGS) IMPLANT
GLOVE BIO SURGEON STRL SZ 6 (GLOVE) ×12 IMPLANT
GLOVE BIOGEL PI IND STRL 6.5 (GLOVE) ×4 IMPLANT
GLOVE BIOGEL PI IND STRL 7.5 (GLOVE) ×8 IMPLANT
GLOVE BIOGEL PI IND STRL 8 (GLOVE) ×4 IMPLANT
GLOVE BIOGEL PI IND STRL 9 (GLOVE) ×2 IMPLANT
GLOVE BIOGEL PI INDICATOR 6.5 (GLOVE) ×4
GLOVE BIOGEL PI INDICATOR 7.5 (GLOVE) ×8
GLOVE BIOGEL PI INDICATOR 8 (GLOVE) ×4
GLOVE BIOGEL PI INDICATOR 9 (GLOVE) ×2
GOWN STRL REUS W/ TWL LRG LVL3 (GOWN DISPOSABLE) ×8 IMPLANT
GOWN STRL REUS W/TWL 2XL LVL3 (GOWN DISPOSABLE) ×8 IMPLANT
GOWN STRL REUS W/TWL LRG LVL3 (GOWN DISPOSABLE) ×8
KIT BASIN OR (CUSTOM PROCEDURE TRAY) ×4 IMPLANT
KIT ROOM TURNOVER OR (KITS) ×4 IMPLANT
NEEDLE 18GX1X1/2 (RX/OR ONLY) (NEEDLE) ×4 IMPLANT
NEEDLE FILTER BLUNT 18X 1/2SAF (NEEDLE)
NEEDLE FILTER BLUNT 18X1 1/2 (NEEDLE) IMPLANT
NEEDLE HYPO 25GX1X1/2 BEV (NEEDLE) ×8 IMPLANT
NS IRRIG 1000ML POUR BTL (IV SOLUTION) ×4 IMPLANT
PACK GENERAL/GYN (CUSTOM PROCEDURE TRAY) ×4 IMPLANT
PAD ARMBOARD 7.5X6 YLW CONV (MISCELLANEOUS) ×8 IMPLANT
SPECIMEN JAR MEDIUM (MISCELLANEOUS) IMPLANT
SPONGE GAUZE 2X2 STER 10/PKG (GAUZE/BANDAGES/DRESSINGS)
SPONGE GAUZE 4X4 12PLY STER LF (GAUZE/BANDAGES/DRESSINGS) ×4 IMPLANT
STOCKINETTE IMPERVIOUS 9X36 MD (GAUZE/BANDAGES/DRESSINGS) IMPLANT
STRIP CLOSURE SKIN 1/2X4 (GAUZE/BANDAGES/DRESSINGS) ×3 IMPLANT
SUT ETHILON 2 0 FS 18 (SUTURE) ×4 IMPLANT
SUT MNCRL AB 4-0 PS2 18 (SUTURE) ×4 IMPLANT
SUT SILK 2 0 FS (SUTURE) ×4 IMPLANT
SUT VIC AB 2-0 CT1 27 (SUTURE) ×2
SUT VIC AB 2-0 CT1 TAPERPNT 27 (SUTURE) ×2 IMPLANT
SUT VIC AB 3-0 SH 27 (SUTURE) ×4
SUT VIC AB 3-0 SH 27X BRD (SUTURE) ×4 IMPLANT
SUT VICRYL 4-0 PS2 18IN ABS (SUTURE) ×4 IMPLANT
SYR CONTROL 10ML LL (SYRINGE) ×8 IMPLANT
TOWEL OR 17X24 6PK STRL BLUE (TOWEL DISPOSABLE) IMPLANT
TOWEL OR 17X26 10 PK STRL BLUE (TOWEL DISPOSABLE) ×4 IMPLANT
TUBE CONNECTING 20'X1/4 (TUBING) ×1
TUBE CONNECTING 20X1/4 (TUBING) ×3 IMPLANT
WATER STERILE IRR 1000ML POUR (IV SOLUTION) IMPLANT

## 2015-10-15 NOTE — Discharge Instructions (Addendum)
[facility] °Office Phone Number [phone] ° ° POST OP INSTRUCTIONS ° °Always review your discharge instruction sheet given to you by the facility where your surgery was performed. ° °IF YOU HAVE DISABILITY OR FAMILY LEAVE FORMS, YOU MUST BRING THEM TO THE OFFICE FOR PROCESSING.  DO NOT GIVE THEM TO YOUR DOCTOR. ° °1. A prescription for pain medication may be given to you upon discharge.  Take your pain medication as prescribed, if needed.  If narcotic pain medicine is not needed, then you may take acetaminophen (Tylenol) or ibuprofen (Advil) as needed. °2. Take your usually prescribed medications unless otherwise directed °3. If you need a refill on your pain medication, please contact your pharmacy.  They will contact our office to request authorization.  Prescriptions will not be filled after 5pm or on week-ends. °4. You should eat very light the first 24 hours after surgery, such as soup, crackers, pudding, etc.  Resume your normal diet the day after surgery °5. It is common to experience some constipation if taking pain medication after surgery.  Increasing fluid intake and taking a stool softener will usually help or prevent this problem from occurring.  A mild laxative (Milk of Magnesia or Miralax) should be taken according to package directions if there are no bowel movements after 48 hours. °6. You may shower in 48 hours.  The surgical glue will flake off in 2-3 weeks.   °7. ACTIVITIES:  No strenuous activity or heavy lifting for 1 week.   °a. You may drive when you no longer are taking prescription pain medication, you can comfortably wear a seatbelt, and you can safely maneuver your car and apply brakes. °b. RETURN TO WORK:  __________to be determined._______________ °You should see your doctor in the office for a follow-up appointment approximately three-four weeks after your surgery.   ° °WHEN TO CALL YOUR DOCTOR: °1. Fever over 101.0 °2. Nausea and/or vomiting. °3. Extreme swelling  or bruising. °4. Continued bleeding from incision. °5. Increased pain, redness, or drainage from the incision. ° °The clinic staff is available to answer your questions during regular business hours.  Please don’t hesitate to call and ask to speak to one of the nurses for clinical concerns.  If you have a medical emergency, go to the nearest emergency room or call 911.  A surgeon from [facility] is always on call at the hospital. ° °For further questions, please visit [email or web address]  ° °

## 2015-10-15 NOTE — Op Note (Signed)
PRE-OPERATIVE DIAGNOSIS: cT1aN0 left back melanoma  POST-OPERATIVE DIAGNOSIS:  Same  PROCEDURE:  Procedure(s): Wide local excision 1 cm margins, advancement flap closure for defect 3 cm x 3 cm, left axillary sentinel lymph node mapping and biopsy  SURGEON:  Surgeon(s): Stark Klein, MD  ASSISTANT: Judyann Munson, RNFA  ANESTHESIA:   local and general  DRAINS: none   LOCAL MEDICATIONS USED:  MARCAINE    and XYLOCAINE   SPECIMEN:  Source of Specimen:  Left axillary sentinel lymph nodes, wide local excision back  FINDINGS:  SLN #1 and 2, palpable.  #3 hot and blue, cps 40, #4 hot, cps 180  DISPOSITION OF SPECIMEN:  PATHOLOGY  COUNTS:  YES  PLAN OF CARE: Discharge to home after PACU  PATIENT DISPOSITION:  PACU - hemodynamically stable.   PROCEDURE:   Pt was identified in the holding area, taken to the OR, and placed supine on the OR table.  General anesthesia was induced.  Time out was performed according to the surgical safety checklist.  Patient was turned to the lateral position.  When all was correct, we continued.  One mL methylene blue was injected intradermally around the melanoma biopsy site.    The patient was placed into the right lateral decubitus position on a beanbag with appropriate padding.  The upper back was prepped and draped in sterile fashion.  The melanoma was identified and 1 cm margins were marked out.  20 mL local was administered under the melanoma and the adjacent tissue.  A #10 blade was used to incise the skin around the melanoma.  The cautery was used to take the dissection down to the fascia.  The skin was marked in situ with silk suture.  The cautery was used to take the specimen off the fascia, and it was passed off the table.    Penetrating towel clips were used to elevate the edges of the incision and the skin was freed up in all directions.  This was pulled together in an oblique orientation. The redundant tissue was taken off both corners to make  an ellipse with sharp tissue scissors.   The skin was pulled together and held with penetrating towel clips. Deep interrupted 2-0 vicryl sutures were placed to relieve tension.  The skin was then reapproximated with 3-0 interrupted vicryl deep dermal sutures and 4-0 monocryl running subcuticular sutures.  Four 2-0 nylon horizontal mattress sutures were placed as well.  The wound was dressed with Benzoin, steristrips, gauze, and tegaderm.    The patient was placed back supine.  The patient's left axilla and chest were prepped and draped in sterile fashion.  The point of maximum signal intensity was identified with the neoprobe.  A 3 cm incision was made with a #15 blade.  The subcutaneous tissues were divided with the cautery.  A Weitlaner retractor was used to assist with visualization.  The tonsil clamp was used to bluntly dissect the axillary fat pad.  4 sentinel lymph nodes were identified as described above.  The lymphovascular channels were clipped with hemoclips.  The nodes were passed off as specimens.  Hemostasis was achieved with the cautery.  The axilla was irrigated and closed with 3-0 Vicryl deep dermal interrupted sutures and 4-0 Monocryl running subcuticular suture.  This was cleaned, dried, and dressed with dermabond.  Counts were correct.  Needle, sponge, and instrument counts were correct.  The patient was awakened from anesthesia and taken to the PACU in stable condition.

## 2015-10-15 NOTE — Transfer of Care (Signed)
Immediate Anesthesia Transfer of Care Note  Patient: Katrina Palmer  Procedure(s) Performed: Procedure(s): WIDE LOCAL EXCISION BACK ADVANCEMENT FLAP CLOUSRE (N/A) LEFT AXILLARY SENTINEL NODE BIOPSY (Left)  Patient Location: PACU  Anesthesia Type:General  Level of Consciousness: awake, alert  and oriented  Airway & Oxygen Therapy: Patient Spontanous Breathing  Post-op Assessment: Report given to RN and Post -op Vital signs reviewed and stable  Post vital signs: Reviewed and stable  Last Vitals:  Vitals:   10/15/15 0704  BP: (!) 151/81  Pulse: 68  Resp: 20  Temp: 36.8 C    Last Pain:  Vitals:   10/15/15 0744  TempSrc:   PainSc: 4       Patients Stated Pain Goal: 3 ([id number])  Complications: No apparent anesthesia complications

## 2015-10-15 NOTE — Interval H&P Note (Signed)
History and Physical Interval Note:  10/15/2015 8:53 AM  Katrina Palmer  has presented today for surgery, with the diagnosis of back melanoma  The various methods of treatment have been discussed with the patient and family. After consideration of risks, benefits and other options for treatment, the patient has consented to  Procedure(s): [address]  WITH SENTINEL LYMPH NODE BIOPSY (N/A) as a surgical intervention .  The patient's history has been reviewed, patient examined, no change in status, stable for surgery.  I have reviewed the patient's chart and labs.  Questions were answered to the patient's satisfaction.     [name]

## 2015-10-15 NOTE — Anesthesia Postprocedure Evaluation (Signed)
Anesthesia Post Note  Patient: Katrina Palmer  Procedure(s) Performed: Procedure(s) (LRB): WIDE LOCAL EXCISION BACK ADVANCEMENT FLAP CLOUSRE (N/A) LEFT AXILLARY SENTINEL NODE BIOPSY (Left)  Patient location during evaluation: PACU Anesthesia Type: General Level of consciousness: awake and alert Pain management: pain level controlled Vital Signs Assessment: post-procedure vital signs reviewed and stable Respiratory status: spontaneous breathing, nonlabored ventilation, respiratory function stable and patient connected to nasal cannula oxygen Cardiovascular status: blood pressure returned to baseline and stable Postop Assessment: no signs of nausea or vomiting Anesthetic complications: no    Last Vitals:  Vitals:   10/15/15 1205 10/15/15 1212  BP:  132/81  Pulse:  67  Resp:  14  Temp: 36.6 C     Last Pain:  Vitals:   10/15/15 1212  TempSrc:   PainSc: 5                  Erial Fikes,JAMES TERRILL

## 2015-10-15 NOTE — Anesthesia Procedure Notes (Signed)
Procedure Name: Intubation Date/Time: 10/15/2015 9:43 AM Performed by: Merdis Delay Pre-anesthesia Checklist: Patient identified, Suction available, Patient being monitored, Timeout performed and Emergency Drugs available Patient Re-evaluated:Patient Re-evaluated prior to inductionOxygen Delivery Method: Circle system utilized Preoxygenation: Pre-oxygenation with 100% oxygen Intubation Type: IV induction Ventilation: Mask ventilation without difficulty and Oral airway inserted - appropriate to patient size Laryngoscope Size: Mac and 3 Grade View: Grade II Tube type: Oral Tube size: 7.0 mm Number of attempts: 1 Airway Equipment and Method: Stylet Placement Confirmation: ETT inserted through vocal cords under direct vision,  positive ETCO2,  breath sounds checked- equal and bilateral and CO2 detector Secured at: 22 cm Tube secured with: Tape Dental Injury: Teeth and Oropharynx as per pre-operative assessment  Comments: Anterior airway. Grade 2.5 view. Base of arytenoids visible only.

## 2015-10-16 ENCOUNTER — Encounter (HOSPITAL_COMMUNITY): Payer: Self-pay | Admitting: General Surgery

## 2015-10-24 ENCOUNTER — Telehealth: Payer: Self-pay | Admitting: General Surgery

## 2015-10-24 NOTE — Telephone Encounter (Signed)
Discussed pathology with Dr. Cristina Gong and with patient.  Will get additional punch biopsies to assess margin.

## 2015-10-25 ENCOUNTER — Other Ambulatory Visit: Payer: Self-pay | Admitting: General Surgery

## 2015-11-06 NOTE — Progress Notes (Signed)
Please let patient know that margins are negative!!!

## 2016-04-20 ENCOUNTER — Ambulatory Visit
Admission: RE | Admit: 2016-04-20 | Discharge: 2016-04-20 | Disposition: A | Discharge status: Home / Self Care | Payer: BLUE CROSS/BLUE SHIELD | Source: Ambulatory Visit | Attending: Otolaryngology | Admitting: Otolaryngology

## 2016-04-20 ENCOUNTER — Other Ambulatory Visit: Payer: Self-pay | Admitting: Otolaryngology

## 2016-04-20 DIAGNOSIS — R0981 Nasal congestion: Secondary | ICD-10-CM

## 2016-07-12 LAB — HM PAP SMEAR: HM Pap smear: NEGATIVE

## 2017-12-02 IMAGING — NM NM LYMPHATICS/LYMPH NODE
5 series · 5 of 5 positions shown · non-contrast
Comparison: None.

CLINICAL DATA: Mid upper back pain

EXAM:
NUCLEAR MEDICINE LYMPHANGIOGRAPHY
TECHNIQUE: Sequential images were obtained following intradermal injection of
radiopharmaceutical at the tumor site in the central aspect of the
upper back.
RADIOPHARMACEUTICALS:  500 microcuries mCi millipore-filtered Lc-QQm
sulfur colloid

[Series 1: lymph ant/post · 2.07mm/px · 1 of 1 slices shown]
[im 1/1  full-range]
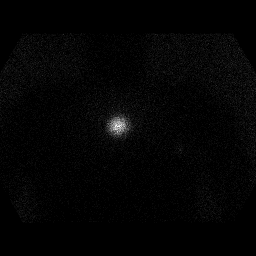

[Series 2: lymph ant/post 2 · 2.07mm/px · 1 of 1 slices shown]
[im 1/1]
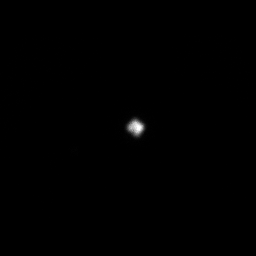

[Series 3: lymph ant/post 3 · 2.07mm/px · 1 of 1 slices shown]
[im 1/1]
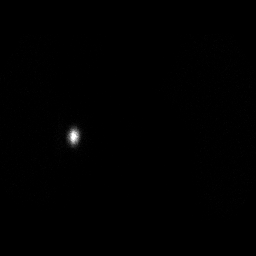

[Series 4: lymph ant/post 4 · 2.07mm/px · 1 of 1 slices shown]
[im 1/1]
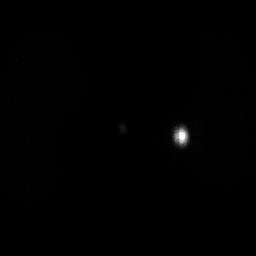

[Series 5: lymph ant/post 5 · 2.07mm/px · 1 of 1 slices shown]
[im 1/1  full-range]
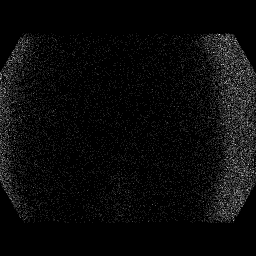

[5 of 5 positions shown; findings below may reference images not displayed]

FINDINGS: Intradermal injection of technetium 99m labeled floor for colloid
was performed around the melanoma site within the upper back. The
immediate posterior images show sentinel node within the left
axilla. This appears more intense on the posterior projection
images. Planar images over the anterior groin region was performed
at 20 minutes which shows no radiotracer uptake.
IMPRESSION: The sentinel node appears to localize to the left axilla.

## 2018-06-15 IMAGING — CR DG SINUSES 1-2V
2 series · 2 of 2 positions shown · non-contrast
Comparison: None.

CLINICAL DATA: Chronic bilateral maxillary sinus pressure and
congestion. Initial encounter.

EXAM:
PARANASAL SINUSES - 1-2 VIEW

[w waters]
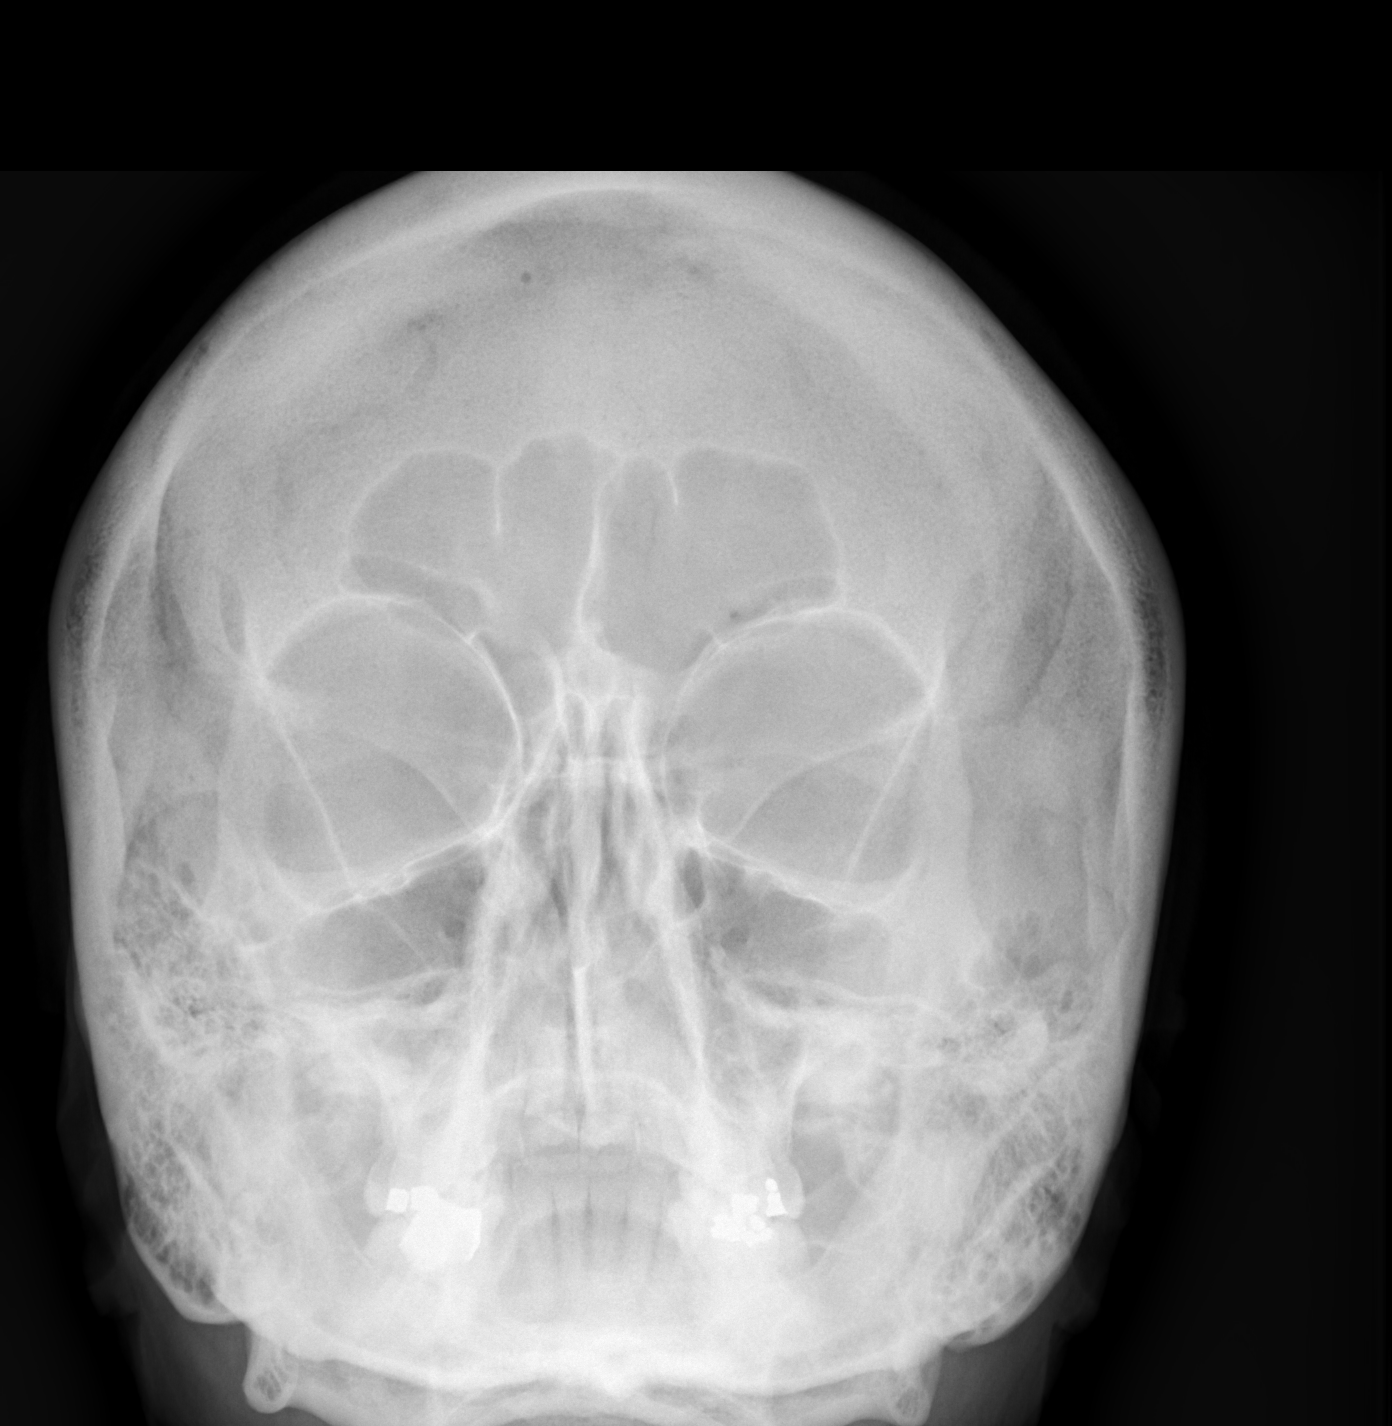

[w skull lat]
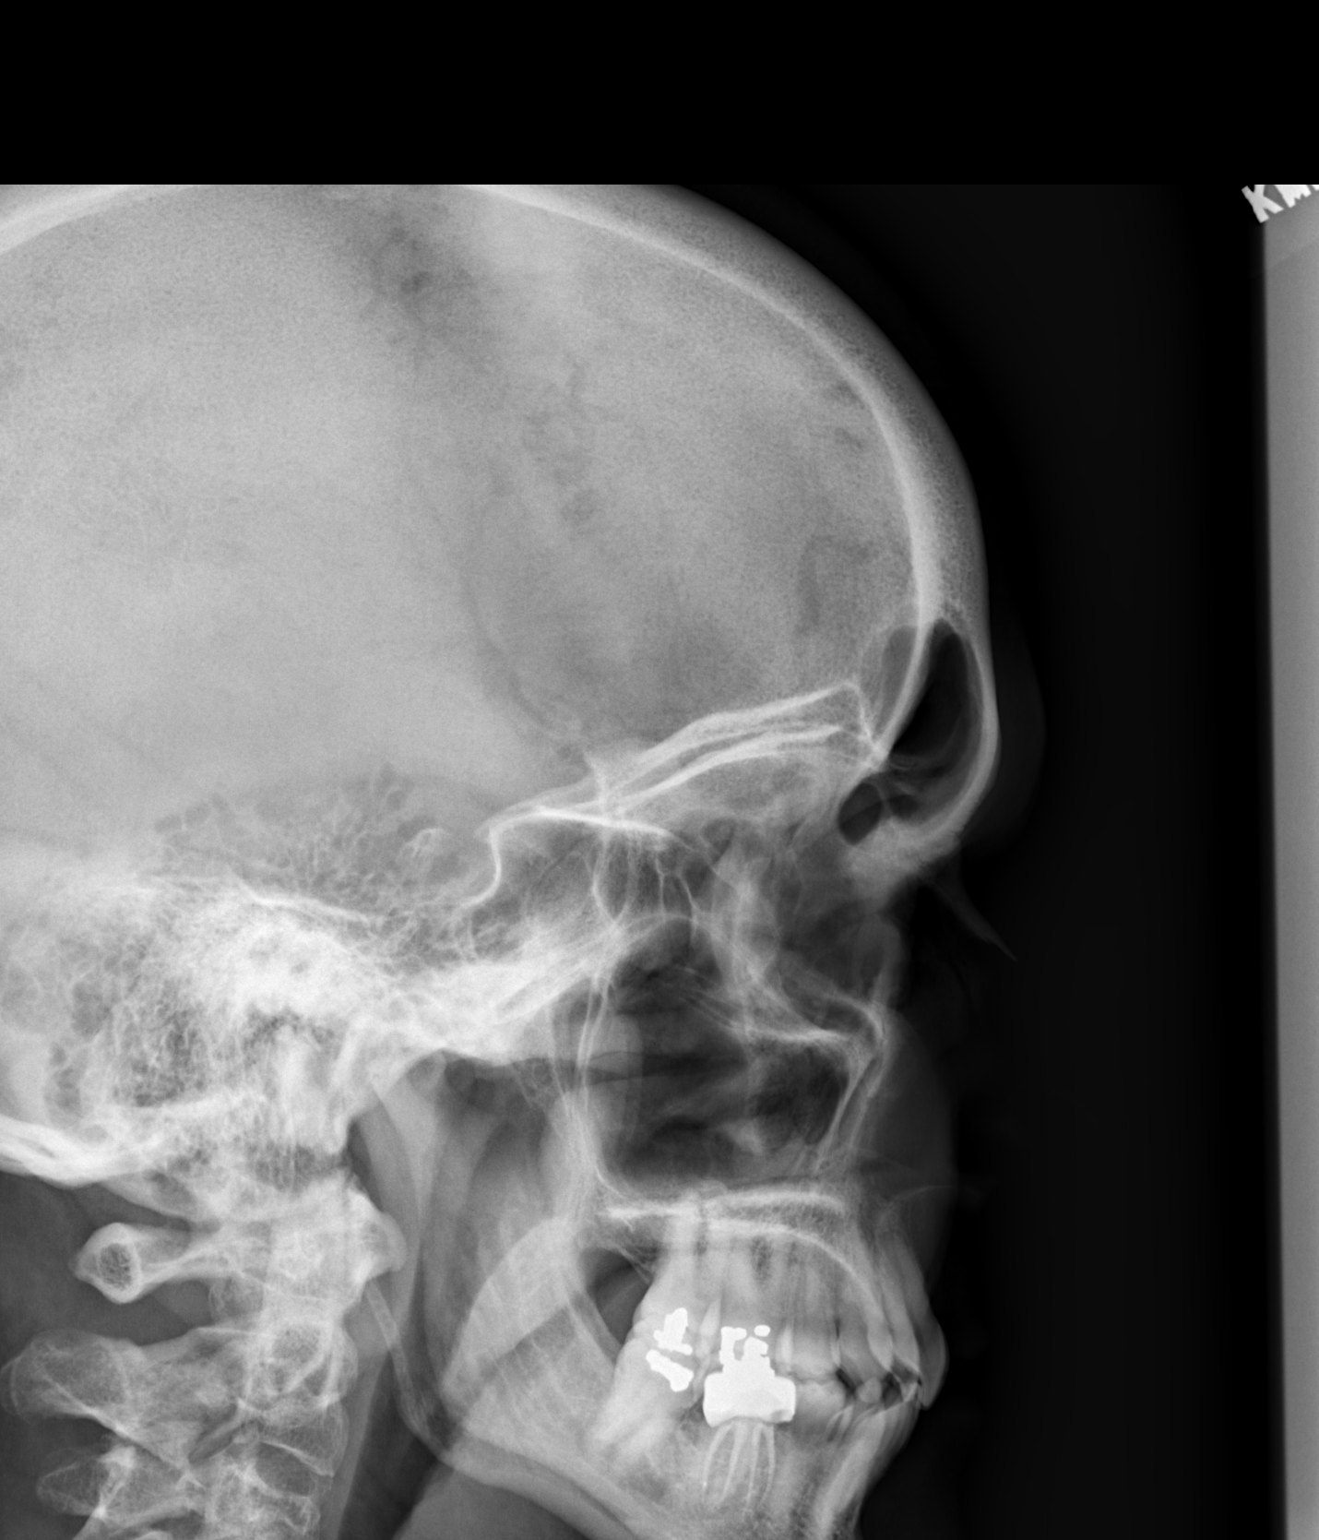

[2 of 2 positions shown; findings below may reference images not displayed]

FINDINGS: The paranasal sinuses appear grossly clear. The bony orbits are
unremarkable in appearance. Visualized dental hardware is grossly
unremarkable. The mastoid air cells are well-aerated.
IMPRESSION: Paranasal sinuses and mastoid air cells are well-aerated.

## 2019-09-18 DIAGNOSIS — Z1231 Encounter for screening mammogram for malignant neoplasm of breast: Secondary | ICD-10-CM | POA: Diagnosis not present

## 2020-01-04 DIAGNOSIS — Z20822 Contact with and (suspected) exposure to covid-19: Secondary | ICD-10-CM | POA: Diagnosis not present

## 2020-01-24 DIAGNOSIS — L565 Disseminated superficial actinic porokeratosis (DSAP): Secondary | ICD-10-CM | POA: Diagnosis not present

## 2020-01-24 DIAGNOSIS — B078 Other viral warts: Secondary | ICD-10-CM | POA: Diagnosis not present

## 2020-01-24 DIAGNOSIS — L918 Other hypertrophic disorders of the skin: Secondary | ICD-10-CM | POA: Diagnosis not present

## 2020-01-24 DIAGNOSIS — Z85828 Personal history of other malignant neoplasm of skin: Secondary | ICD-10-CM | POA: Diagnosis not present

## 2020-01-24 DIAGNOSIS — Z85820 Personal history of malignant melanoma of skin: Secondary | ICD-10-CM | POA: Diagnosis not present

## 2020-01-24 DIAGNOSIS — D485 Neoplasm of uncertain behavior of skin: Secondary | ICD-10-CM | POA: Diagnosis not present

## 2020-03-05 DIAGNOSIS — Z20822 Contact with and (suspected) exposure to covid-19: Secondary | ICD-10-CM | POA: Diagnosis not present

## 2020-03-16 DIAGNOSIS — Z20822 Contact with and (suspected) exposure to covid-19: Secondary | ICD-10-CM | POA: Diagnosis not present

## 2020-05-31 DIAGNOSIS — Z20822 Contact with and (suspected) exposure to covid-19: Secondary | ICD-10-CM | POA: Diagnosis not present

## 2020-06-24 DIAGNOSIS — E119 Type 2 diabetes mellitus without complications: Secondary | ICD-10-CM | POA: Diagnosis not present

## 2020-06-24 DIAGNOSIS — R7989 Other specified abnormal findings of blood chemistry: Secondary | ICD-10-CM | POA: Diagnosis not present

## 2020-06-24 DIAGNOSIS — J019 Acute sinusitis, unspecified: Secondary | ICD-10-CM | POA: Diagnosis not present

## 2020-06-24 DIAGNOSIS — Z0000 Encounter for general adult medical examination without abnormal findings: Secondary | ICD-10-CM | POA: Diagnosis not present

## 2020-06-24 DIAGNOSIS — E538 Deficiency of other specified B group vitamins: Secondary | ICD-10-CM | POA: Diagnosis not present

## 2020-06-24 DIAGNOSIS — D529 Folate deficiency anemia, unspecified: Secondary | ICD-10-CM | POA: Diagnosis not present

## 2020-06-24 DIAGNOSIS — E785 Hyperlipidemia, unspecified: Secondary | ICD-10-CM | POA: Diagnosis not present

## 2020-06-24 DIAGNOSIS — E559 Vitamin D deficiency, unspecified: Secondary | ICD-10-CM | POA: Diagnosis not present

## 2020-06-24 LAB — BASIC METABOLIC PANEL
BUN: 14 (ref 4–21)
CO2: 21 (ref 13–22)
Creatinine: 0.7 (ref 0.5–1.1)
Glucose: 65
Sodium: 142 (ref 137–147)

## 2020-06-24 LAB — LIPID PANEL
Cholesterol: 210 — AB (ref 0–200)
HDL: 81 — AB (ref 35–70)
LDL Cholesterol: 111

## 2020-06-24 LAB — COMPREHENSIVE METABOLIC PANEL
Globulin: 2.6
eGFR: 94

## 2020-06-24 LAB — CBC: RBC: 4.33 (ref 3.87–5.11)

## 2020-06-24 LAB — HEPATIC FUNCTION PANEL
ALT: 9 U/L (ref 7–35)
Alkaline Phosphatase: 59 (ref 25–125)
Bilirubin, Total: 0.4

## 2020-06-24 LAB — VITAMIN B12: Vitamin B-12: 462

## 2020-06-24 LAB — HEMOGLOBIN A1C: Hemoglobin A1C: 5.5

## 2020-06-24 LAB — VITAMIN D 25 HYDROXY (VIT D DEFICIENCY, FRACTURES): Vit D, 25-Hydroxy: 28

## 2020-06-24 LAB — CBC AND DIFFERENTIAL
HCT: 40 (ref 36–46)
Hemoglobin: 13.1 (ref 12.0–16.0)
Platelets: 222 10*3/uL (ref 150–400)
WBC: 4.8

## 2020-08-05 DIAGNOSIS — E559 Vitamin D deficiency, unspecified: Secondary | ICD-10-CM | POA: Diagnosis not present

## 2020-08-05 DIAGNOSIS — E569 Vitamin deficiency, unspecified: Secondary | ICD-10-CM | POA: Diagnosis not present

## 2020-08-28 DIAGNOSIS — Z85820 Personal history of malignant melanoma of skin: Secondary | ICD-10-CM | POA: Diagnosis not present

## 2020-08-28 DIAGNOSIS — L918 Other hypertrophic disorders of the skin: Secondary | ICD-10-CM | POA: Diagnosis not present

## 2020-08-28 DIAGNOSIS — L821 Other seborrheic keratosis: Secondary | ICD-10-CM | POA: Diagnosis not present

## 2020-08-28 DIAGNOSIS — C44519 Basal cell carcinoma of skin of other part of trunk: Secondary | ICD-10-CM | POA: Diagnosis not present

## 2020-08-28 DIAGNOSIS — L570 Actinic keratosis: Secondary | ICD-10-CM | POA: Diagnosis not present

## 2020-08-28 DIAGNOSIS — Z85828 Personal history of other malignant neoplasm of skin: Secondary | ICD-10-CM | POA: Diagnosis not present

## 2020-09-23 DIAGNOSIS — Z1231 Encounter for screening mammogram for malignant neoplasm of breast: Secondary | ICD-10-CM | POA: Diagnosis not present

## 2020-09-23 LAB — HM MAMMOGRAPHY

## 2021-01-21 DIAGNOSIS — J329 Chronic sinusitis, unspecified: Secondary | ICD-10-CM | POA: Diagnosis not present

## 2021-01-27 DIAGNOSIS — L821 Other seborrheic keratosis: Secondary | ICD-10-CM | POA: Diagnosis not present

## 2021-01-27 DIAGNOSIS — D225 Melanocytic nevi of trunk: Secondary | ICD-10-CM | POA: Diagnosis not present

## 2021-01-27 DIAGNOSIS — L570 Actinic keratosis: Secondary | ICD-10-CM | POA: Diagnosis not present

## 2021-01-27 DIAGNOSIS — Z85828 Personal history of other malignant neoplasm of skin: Secondary | ICD-10-CM | POA: Diagnosis not present

## 2021-01-27 DIAGNOSIS — Z85820 Personal history of malignant melanoma of skin: Secondary | ICD-10-CM | POA: Diagnosis not present

## 2021-03-24 ENCOUNTER — Ambulatory Visit (INDEPENDENT_AMBULATORY_CARE_PROVIDER_SITE_OTHER): Payer: BC Managed Care – PPO

## 2021-03-24 ENCOUNTER — Other Ambulatory Visit: Payer: Self-pay

## 2021-03-24 ENCOUNTER — Ambulatory Visit: Payer: BLUE CROSS/BLUE SHIELD | Admitting: Podiatry

## 2021-03-24 DIAGNOSIS — M79671 Pain in right foot: Secondary | ICD-10-CM

## 2021-03-24 DIAGNOSIS — B070 Plantar wart: Secondary | ICD-10-CM | POA: Diagnosis not present

## 2021-03-24 NOTE — Progress Notes (Signed)
° °  Subjective: 61 y.o. female presenting today as a new patient for evaluation of a symptomatic skin lesion to the plantar heel of the right foot.  This is been present for about 2 years now.  She has seen dermatology in the past who debrided the lesion and recommended OTC corn and callus pads.  She says that this did help temporarily however the skin lesion came back.  Occasionally she will get some pain associated to the area she presents for further treatment and evaluation   Past Medical History:  Diagnosis Date   Skin cancer    on legs   Skin cancer    left leg   Skin cancer 08/2015   back     Objective: Physical Exam General: The patient is alert and oriented x3 in no acute distress.   Dermatology: Hyperkeratotic skin lesion(s) noted to the plantar aspect of the right foot approximately 1 cm in diameter.  Skin is warm, dry and supple bilateral lower extremities. Negative for open lesions or macerations.   Vascular: Palpable pedal pulses bilaterally. No edema or erythema noted. Capillary refill within normal limits.   Neurological: Epicritic and protective threshold grossly intact bilaterally.    Musculoskeletal Exam: Pain on palpation to the noted skin lesion(s).  Range of motion within normal limits to all pedal and ankle joints bilateral. Muscle strength 5/5 in all groups bilateral.   Radiographic exam: Normal osseous mineralization.  No spurs identified.  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 #2 Excisional debridement of the plantar wart lesion(s) was performed using a chisel blade.  Salicylic acid was applied and the lesion(s) was dressed with a dry sterile dressing. #3  Salicylic acid provided to apply daily  #4 patient is to return to clinic in 4 weeks      Edrick Kins, DPM [facility]  Dr. Edrick Kins, DPM    [address], [place] [address]                Office [phone]  Fax [phone]

## 2021-04-28 ENCOUNTER — Other Ambulatory Visit: Payer: Self-pay

## 2021-04-28 ENCOUNTER — Ambulatory Visit (INDEPENDENT_AMBULATORY_CARE_PROVIDER_SITE_OTHER): Payer: BC Managed Care – PPO | Admitting: Podiatry

## 2021-04-28 DIAGNOSIS — L858 Other specified epidermal thickening: Secondary | ICD-10-CM | POA: Diagnosis not present

## 2021-04-28 DIAGNOSIS — B070 Plantar wart: Secondary | ICD-10-CM | POA: Diagnosis not present

## 2021-04-28 NOTE — Addendum Note (Signed)
Addended by: Lind Guest on: 04/28/2021 04:46 PM   Modules accepted: Orders

## 2021-04-28 NOTE — Progress Notes (Signed)
° °  HPI: 61 y.o. female presenting today for follow-up evaluation regarding a plantar wart to the plantar aspect of the right heel.  Patient has been applying the salicylic acid as instructed.  She has not noticed significant improvement.  She presents for further treatment and evaluation  Past Medical History:  Diagnosis Date   Skin cancer    on legs   Skin cancer    left leg   Skin cancer 08/2015   back     Past Surgical History:  Procedure Laterality Date   ABDOMINAL HYSTERECTOMY     MELANOMA EXCISION WITH SENTINEL LYMPH NODE BIOPSY N/A 10/15/2015   Procedure: WIDE LOCAL EXCISION BACK ADVANCEMENT FLAP CLOUSRE;  Surgeon: Stark Klein, MD;  Location: [place];  Service: General;  Laterality: N/A;   SENTINEL NODE BIOPSY Left 10/15/2015   Procedure: LEFT AXILLARY SENTINEL NODE BIOPSY;  Surgeon: Stark Klein, MD;  Location: [facility];  Service: General;  Laterality: Left;   SKIN CANCER EXCISION     removed from left leg   TONSILLECTOMY      Allergies  Allergen Reactions   No Known Allergies      Physical Exam: General: The patient is alert and oriented x3 in no acute distress.  Dermatology: Skin is warm, dry and supple bilateral lower extremities.  Maceration of the plantar heel is noted with a central nucleated core.  Pinpoint bleeding upon debridement  Vascular: Palpable pedal pulses bilaterally. Capillary refill within normal limits.  Negative for any significant edema or erythema  Neurological: Light touch and protective threshold grossly intact  Musculoskeletal Exam: No pedal deformities noted  Assessment: 1.  Plantar wart right plantar heel   Plan of Care:  1. Patient evaluated.  2.  After debridement today the decision was made to excise the entire wart lesion and sent to pathology.  The patient agrees.  The area was prepped aseptically and injection to avail of 2% lidocaine with epi infiltrated into the plantar heel.  The patient tolerated this well 3.  The wart lesion was  excised in its entirety and placed in sterile specimen container and sent to pathology 4.  Post care instructions were provided.  Recommend triple antibiotic ointment and a Band-Aid daily 5.  Return to clinic in 2 weeks      Edrick Kins, DPM [facility]  Dr. Edrick Kins, DPM    [address], [place] [address]                Office [phone]  Fax [phone]

## 2021-05-05 DIAGNOSIS — H43813 Vitreous degeneration, bilateral: Secondary | ICD-10-CM | POA: Diagnosis not present

## 2021-05-12 ENCOUNTER — Other Ambulatory Visit: Payer: Self-pay

## 2021-05-12 ENCOUNTER — Ambulatory Visit: Payer: BC Managed Care – PPO | Admitting: Podiatry

## 2021-05-12 DIAGNOSIS — B070 Plantar wart: Secondary | ICD-10-CM | POA: Diagnosis not present

## 2021-05-12 NOTE — Progress Notes (Signed)
? ?  HPI: 61 y.o. female presenting today For follow-up evaluation following excision of plantar verruca right heel.  Patient states that she is feeling much better.  She is walking without pain.  She says that the excision site has healed uneventfully.  She was applying antibiotic ointment and a Band-Aid.  Presenting for follow-up treatment evaluation ? ?Past Medical History:  ?Diagnosis Date  ? Skin cancer   ? on legs  ? Skin cancer   ? left leg  ? Skin cancer 08/2015  ? back   ? ? ?Past Surgical History:  ?Procedure Laterality Date  ? ABDOMINAL HYSTERECTOMY    ? MELANOMA EXCISION WITH SENTINEL LYMPH NODE BIOPSY N/A 10/15/2015  ? Procedure: WIDE LOCAL EXCISION BACK ADVANCEMENT FLAP CLOUSRE;  Surgeon: Stark Klein, MD;  Location: [place];  Service: General;  Laterality: N/A;  ? SENTINEL NODE BIOPSY Left 10/15/2015  ? Procedure: LEFT AXILLARY SENTINEL NODE BIOPSY;  Surgeon: Stark Klein, MD;  Location: [place];  Service: General;  Laterality: Left;  ? SKIN CANCER EXCISION    ? removed from left leg  ? TONSILLECTOMY    ? ? ?Allergies  ?Allergen Reactions  ? No Known Allergies   ? ?  ?Physical Exam: ?General: The patient is alert and oriented x3 in no acute distress. ? ?Dermatology: Skin is warm, dry and supple bilateral lower extremities. Negative for open lesions or macerations.  The plantar verruca excisional site has healed completely. ? ?Vascular: Palpable pedal pulses bilaterally. Capillary refill within normal limits.  Negative for any significant edema or erythema ? ?Neurological: Light touch and protective threshold grossly intact ? ?Musculoskeletal Exam: No pedal deformities noted ? ?Assessment: ?1.  S/p excision of plantar verruca RT heel 04/28/2021 ? ? ?Plan of Care:  ?1. Patient evaluated.  ?2.  Light debridement of the freshly healed callus tissue around the plantar aspect of the right heel was performed using a 312 scalpel without incident or bleeding ?3.  Recommend good supportive shoes and sneakers.  Advised  against going barefoot ?4.  Return to clinic as needed ?  ?  ?Edrick Kins, DPM ?[place] ? ?Dr. Edrick Kins, DPM  ?  ?[address]                                        ?[place], [place] [address]                ?Office [phone]  ?Fax [phone] ? ? ? ? ?

## 2021-07-21 ENCOUNTER — Ambulatory Visit (INDEPENDENT_AMBULATORY_CARE_PROVIDER_SITE_OTHER): Payer: BC Managed Care – PPO | Admitting: Family Medicine

## 2021-07-21 ENCOUNTER — Encounter: Payer: Self-pay | Admitting: Family Medicine

## 2021-07-21 VITALS — BP 106/60 | HR 61 | Temp 97.4°F | Ht 62.0 in | Wt 169.1 lb

## 2021-07-21 DIAGNOSIS — Z0000 Encounter for general adult medical examination without abnormal findings: Secondary | ICD-10-CM

## 2021-07-21 DIAGNOSIS — Z85820 Personal history of malignant melanoma of skin: Secondary | ICD-10-CM | POA: Insufficient documentation

## 2021-07-21 DIAGNOSIS — E785 Hyperlipidemia, unspecified: Secondary | ICD-10-CM | POA: Diagnosis not present

## 2021-07-21 DIAGNOSIS — R7309 Other abnormal glucose: Secondary | ICD-10-CM

## 2021-07-21 DIAGNOSIS — E6609 Other obesity due to excess calories: Secondary | ICD-10-CM

## 2021-07-21 DIAGNOSIS — Z6830 Body mass index (BMI) 30.0-30.9, adult: Secondary | ICD-10-CM | POA: Diagnosis not present

## 2021-07-21 LAB — LIPID PANEL
Cholesterol: 207 mg/dL — ABNORMAL HIGH (ref 0–200)
HDL: 71.7 mg/dL (ref >39.00)
LDL Cholesterol: 120 mg/dL — ABNORMAL HIGH (ref 0–99)
NonHDL: 134.84
Total CHOL/HDL Ratio: 3
Triglycerides: 73 mg/dL (ref 0.0–149.0)
VLDL: 14.6 mg/dL (ref 0.0–40.0)

## 2021-07-21 LAB — COMPREHENSIVE METABOLIC PANEL
ALT: 12 U/L (ref 0–35)
AST: 14 U/L (ref 0–37)
Albumin: 4.3 g/dL (ref 3.5–5.2)
Alkaline Phosphatase: 55 U/L (ref 39–117)
BUN: 15 mg/dL (ref 6–23)
CO2: 30 mEq/L (ref 19–32)
Calcium: 9.3 mg/dL (ref 8.4–10.5)
Chloride: 105 mEq/L (ref 96–112)
Creatinine, Ser: 0.69 mg/dL (ref 0.40–1.20)
GFR: 92.62 mL/min (ref >60.00)
Glucose, Bld: 101 mg/dL — ABNORMAL HIGH (ref 70–99)
Potassium: 4.3 mEq/L (ref 3.5–5.1)
Sodium: 141 mEq/L (ref 135–145)
Total Bilirubin: 0.6 mg/dL (ref 0.2–1.2)
Total Protein: 6.7 g/dL (ref 6.0–8.3)

## 2021-07-21 LAB — TSH: TSH: 1.42 u[IU]/mL (ref 0.35–5.50)

## 2021-07-21 LAB — HEMOGLOBIN A1C: Hgb A1c MFr Bld: 5.6 % (ref 4.6–6.5)

## 2021-07-21 NOTE — Progress Notes (Signed)
Annual Exam   Chief Complaint:  Chief Complaint  Patient presents with   Establish Care    Pt brought in all records from previous PCP.    History of Present Illness:  [name] is a 61 y.o. No obstetric history on file. who LMP was No LMP recorded. Patient has had a hysterectomy., presents today for her annual examination.     Nutrition She does get adequate calcium and Vitamin D in her diet. Exercise: not currently Diet: generally healthy, too many snacks    Social History   Tobacco Use  Smoking Status Former   Years: 3.00   Types: Cigarettes   Quit date: 1982   Years since quitting: 41.4  Smokeless Tobacco Never   Social History   Substance and Sexual Activity  Alcohol Use Yes   Alcohol/week: 2.0 standard drinks   Types: 2 Glasses of wine per week   Social History   Substance and Sexual Activity  Drug Use No     General Health Dentist in the last year: Yes Eye doctor: yes  Safety The patient wears seatbelts: yes.     The patient feels safe at home and in their relationships: yes.   Menstrual:  Past this    Cervical Cancer Screening (21-65):   Last Pap:  2014 - s/p hystectomy  Breast Cancer Screening (Age 33-74):  There is no FH of breast cancer. There is no FH of ovarian cancer. BRCA screening Not Indicated.  Last Mammogram: 2022 - [facility] The patient does want a mammogram this year.    Colon Cancer Screening:  Age 39-75 yo - benefits outweigh the risk. Adults 77-85 yo who have never been screened benefit.  Benefits: 134000 people in 2016 will be diagnosed and 49,000 will die - early detection helps [place]: Complications 2/2 to colonoscopy High Risk (Colonoscopy): genetic disorder (Lynch syndrome or familial adenomatous polyposis), personal hx of IBD, previous adenomatous polyp, or previous colorectal cancer, FamHx start 10 years before the age at diagnosis, increased in males and black race  Options:  FIT - looks for hemoglobin  (blood in the stool) - specific and fairly sensitive - must be done annually Cologuard - looks for DNA and blood - more sensitive - therefore can have more false positives, every 3 years Colonoscopy - every 10 years if normal - sedation, bowl prep, must have someone drive you  Shared decision making and the patient had decided to do Colonoscopy - 2024.   Social History   Tobacco Use  Smoking Status Former   Years: 3.00   Types: Cigarettes   Quit date: 1982   Years since quitting: 41.4  Smokeless Tobacco Never    Lung Cancer Screening (Ages 16-01): not applicable   Weight Wt Readings from Last 3 Encounters:  07/21/21 169 lb 2 oz (76.7 kg)  10/15/15 174 lb (78.9 kg)  10/08/15 174 lb 12.8 oz (79.3 kg)   Patient has high BMI  BMI Readings from Last 1 Encounters:  07/21/21 30.93 kg/m     Chronic disease screening Blood pressure monitoring:  BP Readings from Last 3 Encounters:  07/21/21 106/60  10/15/15 132/81  10/08/15 (!) 144/81    Lipid Monitoring: Indication for screening: age >40, obesity, diabetes, family hx, CV risk factors.  Lipid screening: Yes  Lab Results  Component Value Date   CHOL 210 (A) 06/24/2020   HDL 81 (A) 06/24/2020   LDLCALC 111 06/24/2020   TRIG 87 06/24/2020     Diabetes Screening:  age >54, overweight, family hx, PCOS, hx of gestational diabetes, at risk ethnicity Diabetes Screening screening: Yes  Lab Results  Component Value Date   HGBA1C 5.5 06/24/2020     Past Medical History:  Diagnosis Date   Skin cancer    on legs   Skin cancer    left leg   Skin cancer 08/2015   back     Past Surgical History:  Procedure Laterality Date   ABDOMINAL HYSTERECTOMY     MELANOMA EXCISION WITH SENTINEL LYMPH NODE BIOPSY N/A 10/15/2015   Procedure: WIDE LOCAL EXCISION BACK ADVANCEMENT FLAP CLOUSRE;  Surgeon: Stark Klein, MD;  Location: [place];  Service: General;  Laterality: N/A;   SENTINEL NODE BIOPSY Left 10/15/2015   Procedure: LEFT  AXILLARY SENTINEL NODE BIOPSY;  Surgeon: Stark Klein, MD;  Location: [place];  Service: General;  Laterality: Left;   SKIN CANCER EXCISION     removed from left leg   TONSILLECTOMY      Prior to Admission medications   Medication Sig Start Date End Date Taking? Authorizing Provider  ibuprofen (ADVIL,MOTRIN) 200 MG tablet Take 200 mg by mouth every 6 (six) hours as needed for headache or moderate pain.   Yes [provider]    Allergies  Allergen Reactions   No Known Allergies     Gynecologic History: No LMP recorded. Patient has had a hysterectomy.  Obstetric History: No obstetric history on file.  Social History   Socioeconomic History   Marital status: Married    Spouse name: Scientist, physiological   Number of children: 2   Years of education: associates degree   Highest education level: Not on file  Occupational History   Not on file  Tobacco Use   Smoking status: Former    Years: 3.00    Types: Cigarettes    Quit date: 1982    Years since quitting: 41.4   Smokeless tobacco: Never  Vaping Use   Vaping Use: Never used  Substance and Sexual Activity   Alcohol use: Yes    Alcohol/week: 2.0 standard drinks    Types: 2 Glasses of wine per week   Drug use: No   Sexual activity: Yes    Birth control/protection: Post-menopausal, Surgical  Other Topics Concern   Not on file  Social History Narrative   07/21/21   From: the area   Living: with husband, Marlou Sa [phone])   Work: accounts Education officer, community      Family: 2 children - Location manager and [place] -      Enjoys: lake, boating      Exercise: not currently   Diet: generally healthy, too many snacks      Safety   Seat belts: Yes    Guns: Yes  and secure   Safe in relationships: Yes       Social Determinants of Radio broadcast assistant Strain: Not on file  Food Insecurity: Not on file  Transportation Needs: Not on file  Physical Activity: Not on file  Stress: Not on file  Social Connections: Not on file   Intimate Partner Violence: Not on file    Family History  Problem Relation Age of Onset   Hearing loss Mother    Angina Mother    Hypertension Father    Hyperlipidemia Father    Alcohol abuse Father    Heart disease Father    Diabetes Father    Stroke Father    Hyperlipidemia Brother    Hypertension Brother  Heart disease Brother    Cancer Maternal Grandmother    Colon cancer Maternal Grandmother    Cancer Maternal Grandfather    Heart disease Paternal Grandfather    Depression Paternal Grandfather     Review of Systems  Constitutional:  Negative for chills and fever.  HENT:  Negative for congestion and sore throat.   Eyes:  Negative for blurred vision and double vision.  Respiratory:  Negative for shortness of breath.   Cardiovascular:  Negative for chest pain.  Gastrointestinal:  Negative for heartburn, nausea and vomiting.  Genitourinary: Negative.   Musculoskeletal: Negative.  Negative for myalgias.  Skin:  Negative for rash.  Neurological:  Negative for dizziness and headaches.  Endo/Heme/Allergies:  Does not bruise/bleed easily.  Psychiatric/Behavioral:  Negative for depression. The patient is not nervous/anxious.     Physical Exam BP 106/60   Pulse 61   Temp (!) 97.4 F (36.3 C) (Temporal)   Ht 5' 2"  (1.575 m)   Wt 169 lb 2 oz (76.7 kg)   SpO2 97%   BMI 30.93 kg/m    BP Readings from Last 3 Encounters:  07/21/21 106/60  10/15/15 132/81  10/08/15 (!) 144/81      Physical Exam Constitutional:      General: She is not in acute distress.    Appearance: She is well-developed. She is not diaphoretic.  HENT:     Head: Normocephalic and atraumatic.     Right Ear: External ear normal.     Left Ear: External ear normal.     Nose: Nose normal.  Eyes:     General: No scleral icterus.    Extraocular Movements: Extraocular movements intact.     Conjunctiva/sclera: Conjunctivae normal.  Cardiovascular:     Rate and Rhythm: Normal rate and regular  rhythm.     Heart sounds: No murmur heard. Pulmonary:     Effort: Pulmonary effort is normal. No respiratory distress.     Breath sounds: Normal breath sounds. No wheezing.  Abdominal:     General: Bowel sounds are normal. There is no distension.     Palpations: Abdomen is soft. There is no mass.     Tenderness: There is no abdominal tenderness. There is no guarding or rebound.  Musculoskeletal:        General: Normal range of motion.     Cervical back: Neck supple.  Lymphadenopathy:     Cervical: No cervical adenopathy.  Skin:    General: Skin is warm and dry.     Capillary Refill: Capillary refill takes less than 2 seconds.  Neurological:     Mental Status: She is alert and oriented to person, place, and time.     Deep Tendon Reflexes: Reflexes normal.  Psychiatric:        Mood and Affect: Mood normal.        Behavior: Behavior normal.    Results:  PHQ-9:     07/21/2021   11:10 AM  Depression screen PHQ 2/9  Decreased Interest 0  Down, Depressed, Hopeless 0  PHQ - 2 Score 0       Assessment: 61 y.o. No obstetric history on file. female here for routine annual physical examination.  Plan: Problem List Items Addressed This Visit       Other   Dyslipidemia   Relevant Orders   Lipid panel   History of melanoma   Other Visit Diagnoses     Annual physical exam    -  Primary   Elevated  glucose       Relevant Orders   Hemoglobin A1c   Class 1 obesity due to excess calories without serious comorbidity with body mass index (BMI) of 30.0 to 30.9 in adult       Relevant Orders   Comprehensive metabolic panel   TSH       Screening: -- Blood pressure screen normal -- cholesterol screening: will obtain -- Weight screening: overweight: continue to monitor -- Diabetes Screening: will obtain -- Nutrition: Encouraged healthy diet  The 10-year ASCVD risk score (Arnett DK, et al., 2019) is: 2.6%*   Values used to calculate the score:     Age: 33 years     Sex:  Female     Is Non-Hispanic African American: No     Diabetic: No     Tobacco smoker: No     Systolic Blood Pressure: 902 mmHg     Is BP treated: No     HDL Cholesterol: 81 mg/dL*     Total Cholesterol: 210 mg/dL*     * - Cholesterol units were assumed for this score calculation  -- Statin therapy for Age 40-75 with CVD risk >7.5%  Psych -- Depression screening (PHQ-9):    Safety -- tobacco screening: not using -- alcohol screening:  low-risk usage. -- no evidence of domestic violence or intimate partner violence.   Cancer Screening -- pap smear not collected per ASCCP guidelines -- family history of breast cancer screening: done. not at high risk. -- Mammogram -  will get records -- Colon cancer (age 41+)--  up to date  Immunizations  There is no immunization history on file for this patient.  Will get vaccine records   Encouraged healthy diet and exercise. Encouraged regular vision and dental care.    Lesleigh Noe, MD

## 2021-07-21 NOTE — Patient Instructions (Signed)
Try to get back into exercising  Bathroom issue - stop drinking 1-2 hours before bed - use the bathroom before bed  Sleep hygiene checklist: 1. Avoid naps during the day 2. Avoid stimulants such as caffeine and nicotine. Avoid bedtime alcohol (it can speed onset of sleep but the body's metabolism can cause awakenings). At least 2 hours before bedtime 3. All forms of exercise help ensure sound sleep - limit vigorous exercise to morning or late afternoon 4. Avoid food too close to bedtime including chocolate (which contains caffeine) 5. Soak up natural light 6. Establish regular bedtime routine. 7. Associate bed with sleep - avoid TV, computer or phone, reading while in bed. 8. Ensure pleasant, relaxing sleep environment - quiet, dark, cool room.  Good Sleep Hygiene Habits -- Got to bed and wake up within an hour of the same time every day -- Avoid bright screens (from laptop, phone, TV) within at least 30 minutes before bed. The "blue light" supresses the sleep hormone melatonin and the content may stimulate as well -- Maintain a quiet and dark sleep environment (blackout curtains, turn on a fan or white noise to block out disruptive sounds) -- Practicing relaxing activites before bed (taking a shower, reading a book, journaling, meditation app) -- To quiet a busy mind -- consider journaling before bed (jotting down reminders, worry thoughts, as well as positive things like a gratitude list)   Begin a Mindfulness/Meditation practice -- this can take a little as 3 minutes -- You can find resources in books -- Or you can download apps like  ---- Headspace App (which currently has free content called "Weathering the Storm") ---- Calm (which has a few free options)  ---- Insignt Timer ---- Stop, Breathe & Think  # With each of these Apps - you should decline the "start free trial" offer and as you search through the App should be able to access some of their free content. You can also  chose to pay for the content if you find one that works well for you.   # Many of them also offer sleep specific content which may help with insomnia

## 2021-07-30 ENCOUNTER — Encounter: Payer: Self-pay | Admitting: Family Medicine

## 2021-09-29 DIAGNOSIS — Z1231 Encounter for screening mammogram for malignant neoplasm of breast: Secondary | ICD-10-CM | POA: Diagnosis not present

## 2021-09-29 LAB — HM MAMMOGRAPHY

## 2021-10-02 ENCOUNTER — Encounter: Payer: Self-pay | Admitting: Family Medicine

## 2022-01-26 DIAGNOSIS — Z85820 Personal history of malignant melanoma of skin: Secondary | ICD-10-CM | POA: Diagnosis not present

## 2022-01-26 DIAGNOSIS — L821 Other seborrheic keratosis: Secondary | ICD-10-CM | POA: Diagnosis not present

## 2022-01-26 DIAGNOSIS — Z85828 Personal history of other malignant neoplasm of skin: Secondary | ICD-10-CM | POA: Diagnosis not present

## 2022-01-26 DIAGNOSIS — L570 Actinic keratosis: Secondary | ICD-10-CM | POA: Diagnosis not present

## 2022-01-26 DIAGNOSIS — D225 Melanocytic nevi of trunk: Secondary | ICD-10-CM | POA: Diagnosis not present

## 2022-03-09 ENCOUNTER — Encounter: Payer: Self-pay | Admitting: Nurse Practitioner

## 2022-03-09 ENCOUNTER — Ambulatory Visit: Payer: BC Managed Care – PPO | Admitting: Nurse Practitioner

## 2022-03-09 VITALS — BP 128/78 | HR 69 | Ht 62.0 in | Wt 174.0 lb

## 2022-03-09 DIAGNOSIS — H6123 Impacted cerumen, bilateral: Secondary | ICD-10-CM | POA: Diagnosis not present

## 2022-03-09 DIAGNOSIS — H6992 Unspecified Eustachian tube disorder, left ear: Secondary | ICD-10-CM

## 2022-03-09 DIAGNOSIS — H9312 Tinnitus, left ear: Secondary | ICD-10-CM | POA: Diagnosis not present

## 2022-03-09 MED ORDER — PREDNISONE 20 MG PO TABS: ORAL_TABLET | ORAL | 0 refills | Status: AC

## 2022-03-09 NOTE — Patient Instructions (Addendum)
Nice to see you today Let me know if it does not improve Follow up in 5 months for your physical with me, sooner if you need me

## 2022-03-09 NOTE — Assessment & Plan Note (Signed)
Will start patient on a prednisone taper to see if this helps with the sensation of fullness and decreased hearing.  Patient will update me in office after a week if no improvement and she will need to be referred to ENT.

## 2022-03-09 NOTE — Assessment & Plan Note (Signed)
Unclear etiology.  Prednisone taper, ear irrigation.  Follow-up if no improvement and will refer to ENT

## 2022-03-09 NOTE — Progress Notes (Addendum)
Acute Office Visit  Subjective:     Patient ID: Katrina Palmer, female    DOB: June 18, 1960, 62 y.o.   MRN: [id number]  Chief Complaint  Patient presents with   Tinnitus    Left ear   Ear Fullness    Right ear, nasal congestion resolved, popping/fullness, tried Debrox, denies pain/swelling/drainage      Patient is in today for ear problems with a history of skin cancer  Symptoms started on 12/27. States that she sfelt that she had a sinus infection. States had congestion and cough. States that she felt her ear stopped up and she did try debrox and did not help. States that she is having tinnuits. Right side ringing and left side is new. States was seen by Dr. Memory Dance last visit over three years.  Review of Systems  Constitutional:  Positive for chills (after christmas. resolve). Negative for fever.  HENT:  Positive for ear pain, hearing loss, sinus pain and tinnitus. Negative for ear discharge and sore throat.   Respiratory:  Negative for cough.   Musculoskeletal:  Negative for joint pain and myalgias.  Neurological:  Negative for headaches.        Objective:    BP 128/78   Pulse 69   Ht '5\' 2"'$  (1.575 m)   Wt 174 lb (78.9 kg)   SpO2 96%   BMI 31.83 kg/m    Physical Exam Vitals and nursing note reviewed.  Constitutional:      Appearance: Normal appearance.  HENT:     Head:     Comments: Mild sinus tenders on frontal and maxillary sinus    Right Ear: Ear canal and external ear normal. There is impacted cerumen.     Left Ear: Ear canal and external ear normal. There is impacted cerumen.     Mouth/Throat:     Mouth: Mucous membranes are moist.     Pharynx: Oropharynx is clear.  Cardiovascular:     Rate and Rhythm: Normal rate and regular rhythm.     Pulses: Normal pulses.     Heart sounds: Normal heart sounds.  Pulmonary:     Effort: Pulmonary effort is normal.     Breath sounds: Normal breath sounds.  Lymphadenopathy:     Cervical: No cervical adenopathy.   Neurological:     Mental Status: She is alert.     No results found for any visits on 03/09/22.      Assessment & Plan:   Problem List Items Addressed This Visit       Nervous and Auditory   Eustachian tube disorder, left    Will start patient on a prednisone taper to see if this helps with the sensation of fullness and decreased hearing.  Patient will update me in office after a week if no improvement and she will need to be referred to ENT.      Relevant Medications   predniSONE (DELTASONE) 20 MG tablet   Bilateral impacted cerumen - Primary    Verbal consent obtained.  Patient was prepped per office policy a mixture of water and hydroperoxide was used to irrigate bilateral ears.  Patient tolerated procedure well.  Both impactions were dislodged and removed.  Both TMs within normal limits on post procedure assessment      Relevant Orders   Ear Lavage     Other   Tinnitus of left ear    Unclear etiology.  Prednisone taper, ear irrigation.  Follow-up if no improvement and will refer to  ENT      Relevant Medications   predniSONE (DELTASONE) 20 MG tablet    Meds ordered this encounter  Medications   predniSONE (DELTASONE) 20 MG tablet    Sig: Take 1 tablet (20 mg total) by mouth 2 (two) times daily with a meal for 3 days, THEN 1 tablet (20 mg total) daily with breakfast for 3 days. Avoid NSAIDS like ibuprofen, aleve, naproxen, BC/goody powders.    Dispense:  9 tablet    Refill:  0    Order Specific Question:   Supervising Provider    Answer:   Loura Pardon A [[id number]]    Return in about 5 months (around 08/08/2022) for CPE and labs.  Romilda Garret, NP

## 2022-03-09 NOTE — Assessment & Plan Note (Signed)
Verbal consent obtained.  Patient was prepped per office policy a mixture of water and hydroperoxide was used to irrigate bilateral ears.  Patient tolerated procedure well.  Both impactions were dislodged and removed.  Both TMs within normal limits on post procedure assessment

## 2022-03-17 ENCOUNTER — Telehealth: Payer: Self-pay | Admitting: Nurse Practitioner

## 2022-03-17 DIAGNOSIS — H6123 Impacted cerumen, bilateral: Secondary | ICD-10-CM

## 2022-03-17 DIAGNOSIS — H6992 Unspecified Eustachian tube disorder, left ear: Secondary | ICD-10-CM

## 2022-03-17 DIAGNOSIS — H9312 Tinnitus, left ear: Secondary | ICD-10-CM

## 2022-03-17 NOTE — Telephone Encounter (Signed)
Pt states Cable prescribed the meds, prednisone on 03/09/22 & wad told to let him know if the meds worked or not. Pt states the prednisone didn't work for her & is requesting a antibiotic to be prescribed? Call back # [phone]

## 2022-03-17 NOTE — Telephone Encounter (Signed)
Please advise, thank you!

## 2022-03-18 NOTE — Telephone Encounter (Signed)
She does not need an antibiotic. I offered to refer her to ENT. Would she prefer [facility] or [facility]

## 2022-03-18 NOTE — Addendum Note (Signed)
Addended by: Michela Pitcher on: 03/18/2022 05:05 PM   Modules accepted: Orders

## 2022-03-18 NOTE — Telephone Encounter (Addendum)
Patient voiced understanding, although with some disappointment. Patient agrees to ENT referral, she has no preference on provider or location, would like to get in as soon as possible.  Referral placed, ready for provider to sign.  Thank you!

## 2022-03-18 NOTE — Addendum Note (Signed)
Addended by: Wadie Lessen on: 03/18/2022 02:50 PM   Modules accepted: Orders

## 2022-03-19 ENCOUNTER — Encounter: Payer: Self-pay | Admitting: *Deleted

## 2022-06-16 ENCOUNTER — Encounter: Payer: Self-pay | Admitting: Internal Medicine

## 2022-06-17 DIAGNOSIS — H9313 Tinnitus, bilateral: Secondary | ICD-10-CM | POA: Diagnosis not present

## 2022-06-26 ENCOUNTER — Encounter: Payer: Self-pay | Admitting: Internal Medicine

## 2022-07-31 ENCOUNTER — Encounter: Payer: BC Managed Care – PPO | Admitting: Nurse Practitioner

## 2022-08-03 ENCOUNTER — Encounter: Payer: Self-pay | Admitting: Nurse Practitioner

## 2022-08-03 ENCOUNTER — Ambulatory Visit (INDEPENDENT_AMBULATORY_CARE_PROVIDER_SITE_OTHER): Payer: BC Managed Care – PPO | Admitting: Nurse Practitioner

## 2022-08-03 VITALS — BP 132/72 | HR 70 | Temp 98.0°F | Resp 16 | Ht 62.5 in | Wt 167.4 lb

## 2022-08-03 DIAGNOSIS — Z0000 Encounter for general adult medical examination without abnormal findings: Secondary | ICD-10-CM | POA: Diagnosis not present

## 2022-08-03 DIAGNOSIS — Z23 Encounter for immunization: Secondary | ICD-10-CM

## 2022-08-03 DIAGNOSIS — E669 Obesity, unspecified: Secondary | ICD-10-CM

## 2022-08-03 DIAGNOSIS — Z85820 Personal history of malignant melanoma of skin: Secondary | ICD-10-CM

## 2022-08-03 DIAGNOSIS — Z1231 Encounter for screening mammogram for malignant neoplasm of breast: Secondary | ICD-10-CM

## 2022-08-03 DIAGNOSIS — E785 Hyperlipidemia, unspecified: Secondary | ICD-10-CM

## 2022-08-03 LAB — CBC
HCT: 40.6 % (ref 36.0–46.0)
Hemoglobin: 13.2 g/dL (ref 12.0–15.0)
MCHC: 32.6 g/dL (ref 30.0–36.0)
MCV: 93.4 fl (ref 78.0–100.0)
Platelets: 222 10*3/uL (ref 150.0–400.0)
RBC: 4.35 Mil/uL (ref 3.87–5.11)
RDW: 13.5 % (ref 11.5–15.5)
WBC: 3.9 10*3/uL — ABNORMAL LOW (ref 4.0–10.5)

## 2022-08-03 LAB — COMPREHENSIVE METABOLIC PANEL
ALT: 11 U/L (ref 0–35)
AST: 13 U/L (ref 0–37)
Albumin: 4.3 g/dL (ref 3.5–5.2)
Alkaline Phosphatase: 56 U/L (ref 39–117)
BUN: 15 mg/dL (ref 6–23)
CO2: 28 mEq/L (ref 19–32)
Calcium: 9.2 mg/dL (ref 8.4–10.5)
Chloride: 105 mEq/L (ref 96–112)
Creatinine, Ser: 0.67 mg/dL (ref 0.40–1.20)
GFR: 92.6 mL/min (ref >60.00)
Glucose, Bld: 106 mg/dL — ABNORMAL HIGH (ref 70–99)
Potassium: 4.8 mEq/L (ref 3.5–5.1)
Sodium: 141 mEq/L (ref 135–145)
Total Bilirubin: 0.6 mg/dL (ref 0.2–1.2)
Total Protein: 6.8 g/dL (ref 6.0–8.3)

## 2022-08-03 LAB — LIPID PANEL
Cholesterol: 218 mg/dL — ABNORMAL HIGH (ref 0–200)
HDL: 69.3 mg/dL (ref >39.00)
LDL Cholesterol: 130 mg/dL — ABNORMAL HIGH (ref 0–99)
NonHDL: 148.81
Total CHOL/HDL Ratio: 3
Triglycerides: 95 mg/dL (ref 0.0–149.0)
VLDL: 19 mg/dL (ref 0.0–40.0)

## 2022-08-03 LAB — HEMOGLOBIN A1C: Hgb A1c MFr Bld: 5.7 % (ref 4.6–6.5)

## 2022-08-03 LAB — TSH: TSH: 1.06 u[IU]/mL (ref 0.35–5.50)

## 2022-08-03 NOTE — Assessment & Plan Note (Signed)
Patient is followed by Karlyn Agee, dermatologist every 6 months.  Continue following with specialist as recommended

## 2022-08-03 NOTE — Assessment & Plan Note (Signed)
Patient is been working on lifestyle modifications and lost some weight since last office visit.  Continue pending lab inclusive of TSH, lipid, A1c

## 2022-08-03 NOTE — Patient Instructions (Signed)
Nice to see you today I will be in touch with the labs once I have reviewed them Follow up with me in 1 year, sooner if you need me We updated the tetanus vaccine today  Consider getting the shingles vaccine

## 2022-08-03 NOTE — Assessment & Plan Note (Signed)
History of the same.  Pending lipid panel.  Continue working on healthy lifestyle modifications

## 2022-08-03 NOTE — Assessment & Plan Note (Signed)
Discussed age-appropriate immunizations and screening exams.  Did review patient's personal, surgical, social, family histories.  Update tetanus vaccine today.  Information given on the shingles vaccine.  Other vaccinations are up-to-date.  Patient is up-to-date for CRC screening and has a colonoscopy scheduled.  Mammogram is up-to-date no longer does Pap smears due to hysterectomy.  Patient was given information at discharge about preventative healthcare maintenance with anticipatory guidance.

## 2022-08-03 NOTE — Progress Notes (Signed)
Established Patient Office Visit  Subjective   Patient ID: Katrina Palmer, female    DOB: 1960-06-29  Age: 62 y.o. MRN: [id number]  Chief Complaint  Patient presents with   Annual Exam    HPI  for complete physical and follow up of chronic conditions.   Hx of melanoma: every 6 months with  [facility] dermat[facility]   Immunizations: -Tetanus: Updated today -Influenza: refused  -Shingles: Information discussed in office visit -Pneumonia: too young  -covd: pfizer x 2  Diet: Fair diet. 3 maels a day with some snacks. Water and coffee in the am  Exercise: Walking 3-4 times a week. states that she will do a mile at a time  Eye exam:  reading glasses. Last September  Dental exam: Completes semi-annually    Colonoscopy: Completed in 05/26/2012, repeat in 10 years. This is scheduled already  Lung Cancer Screening: NA  Pap smear: Hysterectomy  Mammogram: 09/29/2021, up-to-date  Sleep: states that she will go to bed aroun 10-130 and will get up at 6-630. Does snore and will wake up several times at night sometimes to urinate and sometimes jst waking up. States that she will     Review of Systems  Constitutional:  Negative for chills and fever.  Respiratory:  Negative for shortness of breath.   Cardiovascular:  Negative for chest pain and leg swelling.  Gastrointestinal:  Negative for abdominal pain, blood in stool, constipation, diarrhea, nausea and vomiting.       BM daily   Genitourinary:  Negative for dysuria and hematuria.  Neurological:  Negative for tingling and headaches.  Psychiatric/Behavioral:  Negative for hallucinations and suicidal ideas.       Objective:     BP 132/72   Pulse 70   Temp 98 F (36.7 C)   Resp 16   Ht 5' 2.5" (1.588 m)   Wt 167 lb 6 oz (75.9 kg)   SpO2 98%   BMI 30.13 kg/m  BP Readings from Last 3 Encounters:  08/03/22 132/72  03/09/22 128/78  07/21/21 106/60   Wt Readings from Last 3 Encounters:  08/03/22 167 lb 6 oz (75.9 kg)   03/09/22 174 lb (78.9 kg)  07/21/21 169 lb 2 oz (76.7 kg)      Physical Exam Vitals and nursing note reviewed.  Constitutional:      Appearance: Normal appearance.  HENT:     Right Ear: Tympanic membrane, ear canal and external ear normal.     Left Ear: Tympanic membrane, ear canal and external ear normal.     Mouth/Throat:     Mouth: Mucous membranes are moist.     Pharynx: Oropharynx is clear.  Eyes:     Extraocular Movements: Extraocular movements intact.     Pupils: Pupils are equal, round, and reactive to light.  Cardiovascular:     Rate and Rhythm: Normal rate and regular rhythm.     Pulses: Normal pulses.     Heart sounds: Normal heart sounds.  Pulmonary:     Effort: Pulmonary effort is normal.     Breath sounds: Normal breath sounds.  Abdominal:     General: Bowel sounds are normal. There is no distension.     Palpations: There is no mass.     Tenderness: There is no abdominal tenderness.     Hernia: No hernia is present.  Musculoskeletal:     Right lower leg: No edema.     Left lower leg: No edema.  Lymphadenopathy:  Cervical: No cervical adenopathy.  Skin:    General: Skin is warm.  Neurological:     General: No focal deficit present.     Mental Status: She is alert.     Deep Tendon Reflexes:     Reflex Scores:      Bicep reflexes are 2+ on the right side and 2+ on the left side.      Patellar reflexes are 2+ on the right side and 2+ on the left side.    Comments: Bilateral upper and lower extremity strength 5/5  Psychiatric:        Mood and Affect: Mood normal.        Behavior: Behavior normal.        Thought Content: Thought content normal.        Judgment: Judgment normal.      No results found for any visits on 08/03/22.    The 10-year ASCVD risk score (Arnett DK, et al., 2019) is: 4.8%    Assessment & Plan:   Problem List Items Addressed This Visit       Other   Dyslipidemia    History of the same.  Pending lipid panel.  Continue  working on healthy lifestyle modifications      Relevant Orders   Hemoglobin A1c   TSH   Lipid panel   History of melanoma    Patient is followed by Karlyn Agee, dermatologist every 6 months.  Continue following with specialist as recommended      Obesity (BMI 30-39.9)    Patient is been working on lifestyle modifications and lost some weight since last office visit.  Continue pending lab inclusive of TSH, lipid, A1c      Relevant Orders   Hemoglobin A1c   TSH   Preventative health care - Primary    Discussed age-appropriate immunizations and screening exams.  Did review patient's personal, surgical, social, family histories.  Update tetanus vaccine today.  Information given on the shingles vaccine.  Other vaccinations are up-to-date.  Patient is up-to-date for CRC screening and has a colonoscopy scheduled.  Mammogram is up-to-date no longer does Pap smears due to hysterectomy.  Patient was given information at discharge about preventative healthcare maintenance with anticipatory guidance.      Relevant Orders   Comprehensive metabolic panel   TSH   CBC   Other Visit Diagnoses     Need for tetanus booster       Relevant Orders   Tdap vaccine greater than or equal to 7yo IM (Completed)   Screening mammogram for breast cancer       Relevant Orders   MM 3D SCREENING MAMMOGRAM BILATERAL BREAST       Return in about 1 year (around 08/03/2023) for CPE and Labs.    Audria Nine, NP

## 2022-08-17 ENCOUNTER — Encounter: Payer: BC Managed Care – PPO | Admitting: Nurse Practitioner

## 2022-08-24 ENCOUNTER — Encounter: Payer: Self-pay | Admitting: Internal Medicine

## 2022-08-24 ENCOUNTER — Ambulatory Visit: Payer: BC Managed Care – PPO | Admitting: Internal Medicine

## 2022-08-24 VITALS — BP 110/80 | HR 80 | Temp 97.2°F | Wt 170.0 lb

## 2022-08-24 DIAGNOSIS — B029 Zoster without complications: Secondary | ICD-10-CM | POA: Insufficient documentation

## 2022-08-24 MED ORDER — VALACYCLOVIR HCL 1 G PO TABS: 1000.0000 mg | ORAL_TABLET | Freq: Three times a day (TID) | ORAL | 1 refills | Status: AC

## 2022-08-24 NOTE — Assessment & Plan Note (Addendum)
Right C8 dermatome from fingers up to back Some active lesions Discussed isolation from immunocompromised individuals Valacyclovir 1000mg  tid x 1 week (#21 x 1) If ongoing pain--would consider Rx with gabapentin) Lidocaine cream prn

## 2022-08-24 NOTE — Progress Notes (Signed)
Subjective:    Patient ID: Katrina Palmer, female    DOB: 1961/02/07, 62 y.o.   MRN: [id number]  HPI Here due to a rash on her right arm  Hand lesion came up 5 days ago Then some on forearm 2 days later Then even more up on arm Thought it might be poison ivy or something  Did see new goat --she did touch it--2 days before rash  Quite painful Itchy Small place on back also  No current outpatient medications on file prior to visit.   No current facility-administered medications on file prior to visit.    Allergies  Allergen Reactions   No Known Allergies     Past Medical History:  Diagnosis Date   Skin cancer    on legs   Skin cancer    left leg   Skin cancer 08/2015   back     Past Surgical History:  Procedure Laterality Date   ABDOMINAL HYSTERECTOMY     MELANOMA EXCISION WITH SENTINEL LYMPH NODE BIOPSY N/A 10/15/2015   Procedure: WIDE LOCAL EXCISION BACK ADVANCEMENT FLAP CLOUSRE;  Surgeon: Almond Lint, MD;  Location: [facility] OR;  Service: General;  Laterality: N/A;   SENTINEL NODE BIOPSY Left 10/15/2015   Procedure: LEFT AXILLARY SENTINEL NODE BIOPSY;  Surgeon: Almond Lint, MD;  Location: [facility] OR;  Service: General;  Laterality: Left;   SKIN CANCER EXCISION     removed from left leg   TONSILLECTOMY      Family History  Problem Relation Age of Onset   Hearing loss Mother    Angina Mother    Hypertension Father    Hyperlipidemia Father    Alcohol abuse Father    Heart disease Father    Diabetes Father    Stroke Father    Hyperlipidemia Brother    Hypertension Brother    Heart disease Brother    Colitis Brother    Cancer Maternal Grandmother    Colon cancer Maternal Grandmother    Cancer Maternal Grandfather    Heart disease Paternal Grandfather    Depression Paternal Grandfather     Social History   Socioeconomic History   Marital status: Married    Spouse name: Katrina Palmer   Number of children: 2   Years of education: associates degree   Highest  education level: Not on file  Occupational History   Not on file  Tobacco Use   Smoking status: Former    Years: 3    Types: Cigarettes    Quit date: 1982    Years since quitting: 42.5   Smokeless tobacco: Never  Vaping Use   Vaping Use: Never used  Substance and Sexual Activity   Alcohol use: Yes    Alcohol/week: 2.0 standard drinks of alcohol    Types: 2 Glasses of wine per week   Drug use: No   Sexual activity: Yes    Birth control/protection: Post-menopausal, Surgical  Other Topics Concern   Not on file  Social History Narrative   07/21/21   From: the area   Living: with husband, Katrina Palmer [phone])   Work: accounts Medical laboratory scientific officer      Family: 2 children - Katrina Palmer and Katrina Palmer -      Enjoys: lake, boating      Exercise: not currently   Diet: generally healthy, too many snacks      Safety   Seat belts: Yes    Guns: Yes  and secure   Safe in relationships: Yes  Social Determinants of Health   Financial Resource Strain: Not on file  Food Insecurity: Not on file  Transportation Needs: Not on file  Physical Activity: Not on file  Stress: Not on file  Social Connections: Not on file  Intimate Partner Violence: Not on file   Review of Systems No fever No N/V     Objective:   Physical Exam Constitutional:      Appearance: Normal appearance.  Skin:    Comments: Extensive papulovesicular rash along C8 dermatome (even on upper back) on right  Neurological:     Mental Status: She is alert.            Assessment & Plan:

## 2022-09-09 DIAGNOSIS — M25562 Pain in left knee: Secondary | ICD-10-CM | POA: Diagnosis not present

## 2022-09-30 DIAGNOSIS — M25562 Pain in left knee: Secondary | ICD-10-CM | POA: Diagnosis not present

## 2022-10-05 DIAGNOSIS — Z1231 Encounter for screening mammogram for malignant neoplasm of breast: Secondary | ICD-10-CM | POA: Diagnosis not present

## 2022-10-05 LAB — HM MAMMOGRAPHY

## 2022-10-06 ENCOUNTER — Encounter: Payer: Self-pay | Admitting: Nurse Practitioner

## 2022-10-30 ENCOUNTER — Ambulatory Visit (AMBULATORY_SURGERY_CENTER): Payer: BC Managed Care – PPO | Admitting: *Deleted

## 2022-10-30 ENCOUNTER — Encounter: Payer: Self-pay | Admitting: Internal Medicine

## 2022-10-30 VITALS — Ht 62.5 in | Wt 165.0 lb

## 2022-10-30 DIAGNOSIS — Z1211 Encounter for screening for malignant neoplasm of colon: Secondary | ICD-10-CM

## 2022-10-30 MED ORDER — NA SULFATE-K SULFATE-MG SULF 17.5-3.13-1.6 GM/177ML PO SOLN
1.0000 | Freq: Once | ORAL | 0 refills | Status: AC
Start: 2022-10-30 — End: 2022-10-30

## 2022-10-30 NOTE — Progress Notes (Signed)
Pt's name and DOB verified at the beginning of the pre-visit.  Pt denies any difficulty with ambulating,sitting, laying down or rolling side to side Gave both [facility] main # and MD on call # prior to instructions.  No egg or soy allergy known to patient  No issues known to pt with past sedation with any surgeries or procedures Pt denies having issues being intubated Pt has no issues moving head neck or swallowing No FH of Malignant Hyperthermia Pt is not on diet pills Pt is not on home 02  Pt is not on blood thinners  Pt denies issues with constipation  Pt is not on dialysis Pt denise any abnormal heart rhythms  Pt denies any upcoming cardiac testing Pt encouraged to use to use Singlecare or Goodrx to reduce cost  Patient's chart reviewed by Katrina Palmer CNRA prior to pre-visit and patient appropriate for the [facility].  Pre-visit completed and red dot placed by patient's name on their procedure day (on provider's schedule).  . Visit by phone Pt states weight is 165 lb Instructed pt why it is important to and  to call if they have any changes in health or new medications. Directed them to the # given and on instructions.   Pt states they will.  Instructions reviewed with pt and pt states understanding. Instructed to review again prior to procedure. Pt states they will.  Instructions sent by mail with coupon and by my chart

## 2022-11-10 ENCOUNTER — Telehealth: Payer: Self-pay | Admitting: Nurse Practitioner

## 2022-11-10 NOTE — Telephone Encounter (Signed)
FYI: This call has been transferred to Access Nurse. Once the result note has been entered staff can address the message at that time.  Patient called in with the following symptoms:  Red Word:elevated blood pressure & leg swelling    Please advise at Mobile [phone] (mobile)  Message is routed to Provider Pool and [facility] Triage    Pt called in stating her BP bottom #'s has been ranging in the 80's but her BP top #s have been ranging in the 130s-140s. Pt states her left leg is also swollen. Scheduled pt for tomorrow, 9/11, with [facility] @ 10:40am due to no available slots today.

## 2022-11-10 NOTE — Telephone Encounter (Signed)
Noted. Will evaluate in office  

## 2022-11-10 NOTE — Telephone Encounter (Signed)
Per appt notes pt already has appt on 11/11/22 at 10:40 with Audria Nine NP. Sending note to Audria Nine NP and [facility] pool.

## 2022-11-11 ENCOUNTER — Ambulatory Visit: Payer: BC Managed Care – PPO | Admitting: Nurse Practitioner

## 2022-11-11 ENCOUNTER — Encounter: Payer: Self-pay | Admitting: Nurse Practitioner

## 2022-11-11 VITALS — BP 136/86 | HR 73 | Temp 98.1°F | Ht 62.5 in | Wt 167.2 lb

## 2022-11-11 DIAGNOSIS — R600 Localized edema: Secondary | ICD-10-CM | POA: Diagnosis not present

## 2022-11-11 DIAGNOSIS — R030 Elevated blood-pressure reading, without diagnosis of hypertension: Secondary | ICD-10-CM | POA: Insufficient documentation

## 2022-11-11 MED ORDER — HYDROCHLOROTHIAZIDE 12.5 MG PO TABS: 12.5000 mg | ORAL_TABLET | Freq: Every day | ORAL | 1 refills | Status: DC

## 2022-11-11 NOTE — Assessment & Plan Note (Signed)
Continue checking blood pressure intermittently at home 3 times a week is plenty.  Will elect to put patient on 12.5 mg HCTZ once daily.  She will follow-up in 1 month for blood pressure recheck, edema recheck, BMP.

## 2022-11-11 NOTE — Patient Instructions (Addendum)
Nice to see you today I have sent in a fluid pill Follow up with me in 1 month, sooner if you need me  Healthy weight and wellness   Address: [address] [place], [place], [place] [address] Phone: [phone]

## 2022-11-11 NOTE — Progress Notes (Signed)
Acute Office Visit  Subjective:     Patient ID: Katrina Palmer, female    DOB: 1960-08-12, 62 y.o.   MRN: [id number]  Chief Complaint  Patient presents with   Hypertension    Pt complains of fluctuating high blood pressure. 120/79 this morning. Yesterday after work 140/86. Pt also complains of burry vision. No dizziness or weakness.    Leg Swelling    Pt complains of left leg swelling. States that swelling goes down when she lays down for bed. Has to sit down for work.      Patient is in today for hypertension with a history of dyslipidemia and melanoma.  Patient reached out to the office and was triaged for left leg swelling and elevated BP (144/86). Of note patient did mention right knee injury with swelling that goes up and down.   States that she just started checking her blood pressure at home because she was told that it could be her blood pressure  Prior to that she had severe OA and MCL starin that requried a brace and she has been having swelling in that leg slower part ever since.  States this works when she is sitting at her desk at work but will go down in the mornings July 3rd incidient happened then saw Ortho July 10th  No history of blood clots, exogenous hormone use, extended traveling without breaks  States that she has a history of blurred visions. State that she is suppose to wear glasses. Statse that it is close up but not far away. States that she does have readers. States that last week it seems to have increased.  She is overdue for her eye exam  Review of Systems  Constitutional:  Negative for chills and fever.  Eyes:  Positive for blurred vision.  Respiratory:  Negative for shortness of breath.   Cardiovascular:  Negative for chest pain.  Neurological:  Negative for dizziness, tingling, weakness and headaches.  Psychiatric/Behavioral:  Negative for hallucinations and suicidal ideas.         Objective:    BP 136/86   Pulse 73   Temp 98.1 F (36.7  C) (Temporal)   Ht 5' 2.5" (1.588 m)   Wt 167 lb 3.2 oz (75.8 kg)   SpO2 96%   BMI 30.09 kg/m    Physical Exam Vitals and nursing note reviewed.  Constitutional:      Appearance: Normal appearance.  HENT:     Mouth/Throat:     Mouth: Mucous membranes are moist.     Pharynx: Oropharynx is clear.  Eyes:     Pupils: Pupils are equal, round, and reactive to light.  Cardiovascular:     Rate and Rhythm: Normal rate and regular rhythm.     Heart sounds: Normal heart sounds.  Pulmonary:     Effort: Pulmonary effort is normal.     Breath sounds: Normal breath sounds.  Musculoskeletal:     Right lower leg: No edema.     Left lower leg: Edema (1+) present.  Lymphadenopathy:     Cervical: No cervical adenopathy.  Neurological:     General: No focal deficit present.     Mental Status: She is alert.     Deep Tendon Reflexes:     Reflex Scores:      Bicep reflexes are 2+ on the right side and 2+ on the left side.      Patellar reflexes are 2+ on the right side and 2+ on the left  side.    Comments: Bilateral lower and upper extremities 5/5     No results found for any visits on 11/11/22.      Assessment & Plan:   Problem List Items Addressed This Visit       Other   Lower extremity edema    Do think this is reactive/dependent edema.  Did encourage patient to use compression socks and elevate leg.  Will also add 12.5 mg of hydrochlorothiazide once daily.  Follow-up 1 month      Relevant Medications   hydrochlorothiazide (HYDRODIURIL) 12.5 MG tablet   Elevated blood pressure reading - Primary    Continue checking blood pressure intermittently at home 3 times a week is plenty.  Will elect to put patient on 12.5 mg HCTZ once daily.  She will follow-up in 1 month for blood pressure recheck, edema recheck, BMP.      Relevant Medications   hydrochlorothiazide (HYDRODIURIL) 12.5 MG tablet    Meds ordered this encounter  Medications   hydrochlorothiazide (HYDRODIURIL) 12.5  MG tablet    Sig: Take 1 tablet (12.5 mg total) by mouth daily.    Dispense:  30 tablet    Refill:  1    Order Specific Question:   Supervising Provider    Answer:   Roxy Manns A [[id number]]    Return in about 4 weeks (around 12/09/2022) for BP recheck, BMP.  Audria Nine, NP

## 2022-11-11 NOTE — Assessment & Plan Note (Signed)
Do think this is reactive/dependent edema.  Did encourage patient to use compression socks and elevate leg.  Will also add 12.5 mg of hydrochlorothiazide once daily.  Follow-up 1 month

## 2022-11-14 ENCOUNTER — Encounter: Payer: Self-pay | Admitting: Certified Registered Nurse Anesthetist

## 2022-11-17 ENCOUNTER — Telehealth: Payer: Self-pay | Admitting: Internal Medicine

## 2022-11-17 NOTE — Telephone Encounter (Signed)
Inbound call from patient stating she has started a new medication. Patient requesting a call to discuss how to proceed for 9/23 procedure. Please advise, thank you.

## 2022-11-17 NOTE — Telephone Encounter (Signed)
Spoke with patient - all questions answered

## 2022-11-23 ENCOUNTER — Ambulatory Visit (AMBULATORY_SURGERY_CENTER): Payer: BC Managed Care – PPO | Admitting: Internal Medicine

## 2022-11-23 ENCOUNTER — Encounter: Payer: Self-pay | Admitting: Internal Medicine

## 2022-11-23 VITALS — BP 147/85 | HR 56 | Temp 98.2°F | Resp 18 | Ht 62.5 in | Wt 165.0 lb

## 2022-11-23 DIAGNOSIS — Z1211 Encounter for screening for malignant neoplasm of colon: Secondary | ICD-10-CM | POA: Diagnosis not present

## 2022-11-23 NOTE — Progress Notes (Signed)
[facility] History and Physical   Primary Care Physician:  Eden Emms, NP   Reason for Procedure:    Encounter Diagnosis  Name Primary?   Colon cancer screening Yes      Plan:    colonoscopy     HPI: Katrina Palmer is a 62 y.o. female here for repeat screening, negative exam 2014   Past Medical History:  Diagnosis Date   Arthritis    L knee   Skin cancer    on legs   Skin cancer    left leg   Skin cancer 08/2015   back     Past Surgical History:  Procedure Laterality Date   ABDOMINAL HYSTERECTOMY     MELANOMA EXCISION WITH SENTINEL LYMPH NODE BIOPSY N/A 10/15/2015   Procedure: WIDE LOCAL EXCISION BACK ADVANCEMENT FLAP CLOUSRE;  Surgeon: Almond Lint, MD;  Location: [facility] OR;  Service: General;  Laterality: N/A;   SENTINEL NODE BIOPSY Left 10/15/2015   Procedure: LEFT AXILLARY SENTINEL NODE BIOPSY;  Surgeon: Almond Lint, MD;  Location: [facility] OR;  Service: General;  Laterality: Left;   SKIN CANCER EXCISION     removed from left leg   TONSILLECTOMY      Prior to Admission medications   Medication Sig Start Date End Date Taking? Authorizing Provider  hydrochlorothiazide (HYDRODIURIL) 12.5 MG tablet Take 1 tablet (12.5 mg total) by mouth daily. 11/11/22  Yes Eden Emms, NP  meloxicam (MOBIC) 15 MG tablet Take 15 mg by mouth daily as needed. Patient not taking: Reported on 11/11/2022 10/07/22   [provider]    Current Outpatient Medications  Medication Sig Dispense Refill   hydrochlorothiazide (HYDRODIURIL) 12.5 MG tablet Take 1 tablet (12.5 mg total) by mouth daily. 30 tablet 1   meloxicam (MOBIC) 15 MG tablet Take 15 mg by mouth daily as needed. (Patient not taking: Reported on 11/11/2022)     No current facility-administered medications for this visit.    Allergies as of 11/23/2022 - Review Complete 11/23/2022  Allergen Reaction Noted   No known allergies  10/14/2015    Family History  Problem Relation Age of Onset   Hearing loss  Mother    Angina Mother    Hypertension Father    Hyperlipidemia Father    Alcohol abuse Father    Heart disease Father    Diabetes Father    Stroke Father    Hyperlipidemia Brother    Hypertension Brother    Heart disease Brother    Colitis Brother    Stomach cancer Maternal Grandmother    Cancer Maternal Grandmother    Colon cancer Maternal Grandmother    Stomach cancer Maternal Grandfather    Cancer Maternal Grandfather    Heart disease Paternal Grandfather    Depression Paternal Grandfather    Colon polyps Neg Hx    Esophageal cancer Neg Hx    Rectal cancer Neg Hx     Social History   Socioeconomic History   Marital status: Married    Spouse name: Public house manager   Number of children: 2   Years of education: associates degree   Highest education level: Not on file  Occupational History   Not on file  Tobacco Use   Smoking status: Former    Current packs/day: 0.00    Types: Cigarettes    Start date: 1979    Quit date: 1982    Years since quitting: 42.7   Smokeless tobacco: Never  Vaping Use   Vaping status: Never  Used  Substance and Sexual Activity   Alcohol use: Yes    Alcohol/week: 2.0 standard drinks of alcohol    Types: 2 Glasses of wine per week   Drug use: No   Sexual activity: Yes    Birth control/protection: Post-menopausal, Surgical  Other Topics Concern   Not on file  Social History Narrative   07/21/21   From: the area   Living: with husband, August Saucer [phone])   Work: accounts Medical laboratory scientific officer      Family: 2 children - Hospital doctor and [place] -      Enjoys: lake, boating      Exercise: not currently   Diet: generally healthy, too many snacks      Safety   Seat belts: Yes    Guns: Yes  and secure   Safe in relationships: Yes       Social Determinants of Corporate investment banker Strain: Not on file  Food Insecurity: Not on file  Transportation Needs: Not on file  Physical Activity: Not on file  Stress: Not on file  Social Connections: Not  on file  Intimate Partner Violence: Not on file    Review of Systems:  All other review of systems negative except as mentioned in the HPI.  Physical Exam: Vital signs BP (!) 165/87   Pulse (!) 56   Temp 98.2 F (36.8 C)   Resp 13   Ht 5' 2.5" (1.588 m)   Wt 165 lb (74.8 kg)   SpO2 100%   BMI 29.70 kg/m   General:   Alert,  Well-developed, well-nourished, pleasant and cooperative in NAD Lungs:  Clear throughout to auscultation.   Heart:  Regular rate and rhythm; no murmurs, clicks, rubs,  or gallops. Abdomen:  Soft, nontender and nondistended. Normal bowel sounds.   Neuro/Psych:  Alert and cooperative. Normal mood and affect. A and O x 3   @Katrina Palmer  Katrina Slate, MD, [facility] [phone] (pager) 11/23/2022 9:53 AM@

## 2022-11-23 NOTE — Progress Notes (Signed)
Pt's states no medical or surgical changes since previsit or office visit. 

## 2022-11-23 NOTE — Progress Notes (Signed)
Report given to PACU, vss 

## 2022-11-23 NOTE — Patient Instructions (Addendum)
Please read handouts provided. Continue present medications. Repeat colonoscopy in 10 years.   YOU HAD AN ENDOSCOPIC PROCEDURE TODAY AT THE [facility]:   Refer to the procedure report that was given to you for any specific questions about what was found during the examination.  If the procedure report does not answer your questions, please call your gastroenterologist to clarify.  If you requested that your care partner not be given the details of your procedure findings, then the procedure report has been included in a sealed envelope for you to review at your convenience later.  YOU SHOULD EXPECT: Some feelings of bloating in the abdomen. Passage of more gas than usual.  Walking can help get rid of the air that was put into your GI tract during the procedure and reduce the bloating. If you had a lower endoscopy (such as a colonoscopy or flexible sigmoidoscopy) you may notice spotting of blood in your stool or on the toilet paper. If you underwent a bowel prep for your procedure, you may not have a normal bowel movement for a few days.  Please Note:  You might notice some irritation and congestion in your nose or some drainage.  This is from the oxygen used during your procedure.  There is no need for concern and it should clear up in a day or so.  SYMPTOMS TO REPORT IMMEDIATELY:  Following lower endoscopy (colonoscopy or flexible sigmoidoscopy):  Excessive amounts of blood in the stool  Significant tenderness or worsening of abdominal pains  Swelling of the abdomen that is new, acute  Fever of 100F or higher  For urgent or emergent issues, a gastroenterologist can be reached at any hour by calling [phone]. Do not use MyChart messaging for urgent concerns.    DIET:  We do recommend a small meal at first, but then you may proceed to your regular diet.  Drink plenty of fluids but you should avoid alcoholic beverages for 24 hours.  ACTIVITY:  You should plan to take it  easy for the rest of today and you should NOT DRIVE or use heavy machinery until tomorrow (because of the sedation medicines used during the test).    FOLLOW UP: Our staff will call the number listed on your records the next business day following your procedure.  We will call around 7:15- 8:00 am to check on you and address any questions or concerns that you may have regarding the information given to you following your procedure. If we do not reach you, we will leave a message.     If any biopsies were taken you will be contacted by phone or by letter within the next 1-3 weeks.  Please call us at [phone] if you have not heard about the biopsies in 3 weeks.    SIGNATURES/CONFIDENTIALITY: You and/or your care partner have signed paperwork which will be entered into your electronic medical record.  These signatures attest to the fact that that the information above on your After Visit Summary has been reviewed and is understood.  Full responsibility of the confidentiality of this discharge information lies with you and/or your care-partner.Normal exam again!  No polyps or cancer seen.  Next routine colonoscopy or other screening test in 10 years - 2034.  I appreciate the opportunity to care for you. Iva Boop, MD, [facility]

## 2022-11-23 NOTE — Op Note (Signed)
[facility] Patient Name: Katrina Palmer Procedure Date: 11/23/2022 9:54 AM MRN: [id number] Endoscopist: Iva Boop , MD, [id number] Age: 62 Referring MD:  Date of Birth: October 14, 1960 Gender: Female Account #: [id number] Procedure:                Colonoscopy Indications:              Screening for colorectal malignant neoplasm, Last                            colonoscopy: 2014 Medicines:                Monitored Anesthesia Care Procedure:                Pre-Anesthesia Assessment:                           - Prior to the procedure, a History and Physical                            was performed, and patient medications and                            allergies were reviewed. The patient's tolerance of                            previous anesthesia was also reviewed. The risks                            and benefits of the procedure and the sedation                            options and risks were discussed with the patient.                            All questions were answered, and informed consent                            was obtained. Prior Anticoagulants: The patient has                            taken no anticoagulant or antiplatelet agents. ASA                            Grade Assessment: I - A normal, healthy patient.                            After reviewing the risks and benefits, the patient                            was deemed in satisfactory condition to undergo the                            procedure.  After obtaining informed consent, the colonoscope                            was passed under direct vision. Throughout the                            procedure, the patient's blood pressure, pulse, and                            oxygen saturations were monitored continuously. The                            Olympus CF-HQ190L ([id number]) Colonoscope was                            introduced through the anus and advanced to the the                             cecum, identified by appendiceal orifice and                            ileocecal valve. The colonoscopy was performed                            without difficulty. The patient tolerated the                            procedure well. The quality of the bowel                            preparation was good. The ileocecal valve,                            appendiceal orifice, and rectum were photographed.                            The bowel preparation used was Miralax via split                            dose instruction. Scope In: 10:07:29 AM Scope Out: 10:22:02 AM Scope Withdrawal Time: 0 hours 10 minutes 24 seconds  Total Procedure Duration: 0 hours 14 minutes 33 seconds  Findings:                 The perianal and digital rectal examinations were                            normal.                           The entire examined colon appeared normal on direct                            and retroflexion views. Complications:            No immediate complications. Estimated Blood  Loss:     Estimated blood loss: none. Impression:               - The entire examined colon is normal on direct and                            retroflexion views.                           - No specimens collected. Recommendation:           - Patient has a contact number available for                            emergencies. The signs and symptoms of potential                            delayed complications were discussed with the                            patient. Return to normal activities tomorrow.                            Written discharge instructions were provided to the                            patient.                           - Resume previous diet.                           - Continue present medications.                           - Repeat colonoscopy in 10 years for screening                            purposes. Iva Boop, MD 11/23/2022 10:27:29 AM This report has  been signed electronically.

## 2022-11-24 ENCOUNTER — Telehealth: Payer: Self-pay | Admitting: *Deleted

## 2022-11-24 NOTE — Telephone Encounter (Signed)
  Follow up Call-     11/23/2022    9:25 AM  Call back number  Post procedure Call Back phone  # [phone]  Permission to leave phone message Yes     Patient questions:  Do you have a fever, pain , or abdominal swelling? No. Pain Score  0 *  Have you tolerated food without any problems? Yes.    Have you been able to return to your normal activities? Yes.    Do you have any questions about your discharge instructions: Diet   No. Medications  No. Follow up visit  No.  Do you have questions or concerns about your Care? No.  Actions: * If pain score is 4 or above: No action needed, pain <4.

## 2022-12-07 ENCOUNTER — Ambulatory Visit: Payer: BC Managed Care – PPO | Admitting: Nurse Practitioner

## 2022-12-08 ENCOUNTER — Encounter: Payer: Self-pay | Admitting: Nurse Practitioner

## 2022-12-14 ENCOUNTER — Encounter: Payer: Self-pay | Admitting: Nurse Practitioner

## 2022-12-14 ENCOUNTER — Ambulatory Visit: Payer: BC Managed Care – PPO | Admitting: Nurse Practitioner

## 2022-12-14 VITALS — BP 124/72 | HR 71 | Temp 98.3°F | Ht 62.5 in | Wt 171.8 lb

## 2022-12-14 DIAGNOSIS — R030 Elevated blood-pressure reading, without diagnosis of hypertension: Secondary | ICD-10-CM

## 2022-12-14 DIAGNOSIS — H6992 Unspecified Eustachian tube disorder, left ear: Secondary | ICD-10-CM | POA: Diagnosis not present

## 2022-12-14 DIAGNOSIS — R600 Localized edema: Secondary | ICD-10-CM

## 2022-12-14 DIAGNOSIS — H6991 Unspecified Eustachian tube disorder, right ear: Secondary | ICD-10-CM

## 2022-12-14 DIAGNOSIS — Z23 Encounter for immunization: Secondary | ICD-10-CM | POA: Diagnosis not present

## 2022-12-14 LAB — BASIC METABOLIC PANEL
BUN: 18 mg/dL (ref 6–23)
CO2: 29 meq/L (ref 19–32)
Calcium: 9.4 mg/dL (ref 8.4–10.5)
Chloride: 103 meq/L (ref 96–112)
Creatinine, Ser: 0.66 mg/dL (ref 0.40–1.20)
GFR: 92.7 mL/min (ref >60.00)
Glucose, Bld: 100 mg/dL — ABNORMAL HIGH (ref 70–99)
Potassium: 4.7 meq/L (ref 3.5–5.1)
Sodium: 139 meq/L (ref 135–145)

## 2022-12-14 NOTE — Assessment & Plan Note (Signed)
Marked improvement with addition of hydrochlorothiazide 12.5 mg daily.  Continue medication as prescribed

## 2022-12-14 NOTE — Assessment & Plan Note (Signed)
Blood pressure will under control in office.  Will continue hydrochlorothiazide 12.5 mg daily.  Pending BMP today

## 2022-12-14 NOTE — Progress Notes (Signed)
Etd   Established Patient Office Visit  Subjective   Patient ID: Katrina Palmer, female    DOB: 1960/08/14  Age: 62 y.o. MRN: [id number]  Chief Complaint  Patient presents with   Follow-up    Pt states that she is doing fine. States that leg swelling has gone    HPI   HTN: patient was seen by me on 11/11/2022 with elevated BP reading and swelling to her lower extremity. She was placed on hydrochlorothiazide 12.5mg  daily and is here for a recheck. States that she is checking her blood pressure at home and getting 144s on the top.  Patient states the swelling has decreased markedly with the addition of the medication    Review of Systems  Constitutional:  Negative for chills and fever.  Respiratory:  Negative for shortness of breath.   Cardiovascular:  Negative for chest pain.  Neurological:  Negative for headaches.  Psychiatric/Behavioral:  Negative for hallucinations.       Objective:     BP 124/72   Pulse 71   Temp 98.3 F (36.8 C) (Oral)   Ht 5' 2.5" (1.588 m)   Wt 171 lb 12.8 oz (77.9 kg)   SpO2 98%   BMI 30.92 kg/m  BP Readings from Last 3 Encounters:  12/14/22 124/72  11/23/22 (!) 147/85  11/11/22 136/86   Wt Readings from Last 3 Encounters:  12/14/22 171 lb 12.8 oz (77.9 kg)  11/23/22 165 lb (74.8 kg)  11/11/22 167 lb 3.2 oz (75.8 kg)   SpO2 Readings from Last 3 Encounters:  12/14/22 98%  11/23/22 99%  11/11/22 96%      Physical Exam Vitals and nursing note reviewed.  Constitutional:      Appearance: Normal appearance.  HENT:     Right Ear: Tympanic membrane, ear canal and external ear normal.     Left Ear: Tympanic membrane, ear canal and external ear normal.  Cardiovascular:     Rate and Rhythm: Normal rate and regular rhythm.     Heart sounds: Normal heart sounds.  Pulmonary:     Effort: Pulmonary effort is normal.     Breath sounds: Normal breath sounds.  Musculoskeletal:     Right lower leg: No edema.     Left lower leg: No edema.   Lymphadenopathy:     Cervical: No cervical adenopathy.  Neurological:     Mental Status: She is alert.      No results found for any visits on 12/14/22.    The 10-year ASCVD risk score (Arnett DK, et al., 2019) is: 4.4%    Assessment & Plan:   Problem List Items Addressed This Visit       Nervous and Auditory   Eustachian tube disorder, left    Patient to try Flonase 2 sprays each nostril daily over-the-counter.        Other   Lower extremity edema    Marked improvement with addition of hydrochlorothiazide 12.5 mg daily.  Continue medication as prescribed      Relevant Orders   Basic metabolic panel   Elevated blood pressure reading - Primary    Blood pressure will under control in office.  Will continue hydrochlorothiazide 12.5 mg daily.  Pending BMP today      Relevant Orders   Basic metabolic panel   Other Visit Diagnoses     Dysfunction of right eustachian tube       Need for influenza vaccination       Relevant Orders  Flu vaccine trivalent PF, 6mos and older(Flulaval,Afluria,Fluarix,Fluzone) (Completed)       Return in about 8 months (around 08/14/2023).    Audria Nine, NP

## 2022-12-14 NOTE — Patient Instructions (Addendum)
Nice to see you today I will be in touch with the labs once I have them Continue the hydrochlorothiazide as prescribed Follow up with me in approx 8 months for your physical with labs   Healthy weight and wellness   Address: [address] [place], [place], [place] [address] Phone: [phone]

## 2022-12-14 NOTE — Assessment & Plan Note (Signed)
Patient to try Flonase 2 sprays each nostril daily over-the-counter.

## 2022-12-15 ENCOUNTER — Encounter: Payer: Self-pay | Admitting: Nurse Practitioner

## 2023-02-01 DIAGNOSIS — Z85828 Personal history of other malignant neoplasm of skin: Secondary | ICD-10-CM | POA: Diagnosis not present

## 2023-02-01 DIAGNOSIS — Z85820 Personal history of malignant melanoma of skin: Secondary | ICD-10-CM | POA: Diagnosis not present

## 2023-02-01 DIAGNOSIS — D225 Melanocytic nevi of trunk: Secondary | ICD-10-CM | POA: Diagnosis not present

## 2023-02-01 DIAGNOSIS — L905 Scar conditions and fibrosis of skin: Secondary | ICD-10-CM | POA: Diagnosis not present

## 2023-02-01 DIAGNOSIS — L570 Actinic keratosis: Secondary | ICD-10-CM | POA: Diagnosis not present

## 2023-03-29 ENCOUNTER — Ambulatory Visit: Payer: BC Managed Care – PPO | Admitting: Nurse Practitioner

## 2023-03-29 VITALS — BP 140/88 | HR 62 | Temp 98.1°F | Ht 62.5 in | Wt 169.0 lb

## 2023-03-29 DIAGNOSIS — M791 Myalgia, unspecified site: Secondary | ICD-10-CM | POA: Insufficient documentation

## 2023-03-29 DIAGNOSIS — R0789 Other chest pain: Secondary | ICD-10-CM | POA: Diagnosis not present

## 2023-03-29 LAB — CK: Total CK: 55 U/L (ref 7–177)

## 2023-03-29 LAB — SEDIMENTATION RATE: Sed Rate: 22 mm/h (ref 0–30)

## 2023-03-29 NOTE — Assessment & Plan Note (Signed)
Unclear etiology.  No recent illness or sickness.  She is having some soreness along her chest and bilateral upper arms.  No acute vision changes or temporal headaches.  Will check a sedimentation rate along with CK.

## 2023-03-29 NOTE — Progress Notes (Signed)
Acute Office Visit  Subjective:     Patient ID: Katrina Palmer, female    DOB: [birth year], 63 y.o.   MRN: [id number]  Chief Complaint  Patient presents with   Gastroesophageal Reflux    Pt complains of a sore chest that comes and goes. States that she has soreness, tightness and discomfort in chest area when stretching her arms. Would like EKG done to rule out any suspicions. States she is going on a cruise soon and wants to be cleared of anything. Symptoms started around Christmas time and can last up to 30 minutes.      Patient is in today for chest discomfort with a history of elevated BP, skin cancer, and lower extremity edema.  She does have a family history of heart disease  She first noticed it in December with her day to day life. States that she is having chest soreness. She is not doing any exercise. She states that it will self abate. It will go away in approx 20 mins with out intervention. States that she has not notived it with caffiene use,or eating or when she lays down. States that she does have a history of palpitations. She did have the palpitations worked up with a stress test and it was fine.   Elevated BP: states that she did take herself off the hydrochlorothiazide because she stopped having lower extremity    Review of Systems  Constitutional:  Negative for chills and fever.  Eyes:  Positive for blurred vision (nothing acute).  Respiratory:  Negative for shortness of breath.   Cardiovascular:  Positive for chest pain and palpitations.  Gastrointestinal:  Negative for abdominal pain.  Neurological:  Negative for headaches.        Objective:    BP (!) 140/88   Pulse 62   Temp 98.1 F (36.7 C) (Oral)   Ht 5' 2.5" (1.588 m)   Wt 169 lb (76.7 kg)   SpO2 98%   BMI 30.42 kg/m  BP Readings from Last 3 Encounters:  03/29/23 (!) 140/88  12/14/22 124/72  11/23/22 (!) 147/85   Wt Readings from Last 3 Encounters:  03/29/23 169 lb (76.7 kg)  12/14/22 171  lb 12.8 oz (77.9 kg)  11/23/22 165 lb (74.8 kg)   SpO2 Readings from Last 3 Encounters:  03/29/23 98%  12/14/22 98%  11/23/22 99%      Physical Exam Vitals and nursing note reviewed.  Constitutional:      Appearance: Normal appearance.  Cardiovascular:     Rate and Rhythm: Normal rate and regular rhythm.     Heart sounds: Normal heart sounds.  Pulmonary:     Effort: Pulmonary effort is normal.     Breath sounds: Normal breath sounds.  Chest:     Chest wall: Tenderness present.  Abdominal:     General: Bowel sounds are normal. There is no distension.     Palpations: There is no mass.     Tenderness: There is no abdominal tenderness.     Hernia: No hernia is present.  Neurological:     Mental Status: She is alert.     No results found for any visits on 03/29/23.      Assessment & Plan:   Problem List Items Addressed This Visit       Other   Atypical chest pain - Primary   Musculoskeletal in nature.  EKG within normal limits.  Patient has a family history of heart disease.  Blood pressure slightly  elevated that is because she is stopped taking hydrochlorothiazide instructed patient get back on the HCTZ.  Chest discomfort is reproducible on office      Relevant Orders   EKG 12-Lead (Completed)   Sedimentation rate   CK   Muscle soreness   Unclear etiology.  No recent illness or sickness.  She is having some soreness along her chest and bilateral upper arms.  No acute vision changes or temporal headaches.  Will check a sedimentation rate along with CK.      Relevant Orders   Sedimentation rate   CK    No orders of the defined types were placed in this encounter.   Return in about 6 months (around 09/26/2023) for CPE and Labs.  Audria Nine, NP

## 2023-03-29 NOTE — Assessment & Plan Note (Addendum)
Musculoskeletal in nature.  EKG within normal limits.  Patient has a family history of heart disease.  Blood pressure slightly elevated that is because she is stopped taking hydrochlorothiazide instructed patient get back on the HCTZ.  Chest discomfort is reproducible on office

## 2023-03-29 NOTE — Patient Instructions (Signed)
Nice to see you today Heart tracing looked good I will be in touch with the labs once I have them Follow up with me in 6 months for you physical

## 2023-03-30 ENCOUNTER — Encounter: Payer: Self-pay | Admitting: Nurse Practitioner

## 2023-05-06 ENCOUNTER — Other Ambulatory Visit: Payer: Self-pay | Admitting: Nurse Practitioner

## 2023-05-06 DIAGNOSIS — R030 Elevated blood-pressure reading, without diagnosis of hypertension: Secondary | ICD-10-CM

## 2023-05-06 DIAGNOSIS — R600 Localized edema: Secondary | ICD-10-CM

## 2023-07-05 DIAGNOSIS — K080 Exfoliation of teeth due to systemic causes: Secondary | ICD-10-CM | POA: Diagnosis not present

## 2023-07-29 ENCOUNTER — Other Ambulatory Visit: Payer: Self-pay | Admitting: Nurse Practitioner

## 2023-07-29 DIAGNOSIS — R030 Elevated blood-pressure reading, without diagnosis of hypertension: Secondary | ICD-10-CM

## 2023-07-29 DIAGNOSIS — R600 Localized edema: Secondary | ICD-10-CM

## 2023-09-20 ENCOUNTER — Encounter: Payer: Self-pay | Admitting: Nurse Practitioner

## 2023-09-20 ENCOUNTER — Ambulatory Visit (INDEPENDENT_AMBULATORY_CARE_PROVIDER_SITE_OTHER): Payer: BC Managed Care – PPO | Admitting: Nurse Practitioner

## 2023-09-20 VITALS — BP 110/76 | HR 67 | Temp 98.1°F | Ht 62.0 in | Wt 170.2 lb

## 2023-09-20 DIAGNOSIS — Z114 Encounter for screening for human immunodeficiency virus [HIV]: Secondary | ICD-10-CM | POA: Diagnosis not present

## 2023-09-20 DIAGNOSIS — Z87891 Personal history of nicotine dependence: Secondary | ICD-10-CM | POA: Diagnosis not present

## 2023-09-20 DIAGNOSIS — R7303 Prediabetes: Secondary | ICD-10-CM | POA: Diagnosis not present

## 2023-09-20 DIAGNOSIS — E7800 Pure hypercholesterolemia, unspecified: Secondary | ICD-10-CM | POA: Diagnosis not present

## 2023-09-20 DIAGNOSIS — Z1159 Encounter for screening for other viral diseases: Secondary | ICD-10-CM | POA: Diagnosis not present

## 2023-09-20 DIAGNOSIS — Z23 Encounter for immunization: Secondary | ICD-10-CM | POA: Diagnosis not present

## 2023-09-20 DIAGNOSIS — Z0000 Encounter for general adult medical examination without abnormal findings: Secondary | ICD-10-CM

## 2023-09-20 DIAGNOSIS — Z13820 Encounter for screening for osteoporosis: Secondary | ICD-10-CM

## 2023-09-20 LAB — COMPREHENSIVE METABOLIC PANEL WITH GFR
ALT: 11 U/L (ref 0–35)
AST: 14 U/L (ref 0–37)
Albumin: 4.3 g/dL (ref 3.5–5.2)
Alkaline Phosphatase: 59 U/L (ref 39–117)
BUN: 16 mg/dL (ref 6–23)
CO2: 28 meq/L (ref 19–32)
Calcium: 9 mg/dL (ref 8.4–10.5)
Chloride: 102 meq/L (ref 96–112)
Creatinine, Ser: 0.63 mg/dL (ref 0.40–1.20)
GFR: 93.24 mL/min (ref >60.00)
Glucose, Bld: 106 mg/dL — ABNORMAL HIGH (ref 70–99)
Potassium: 4.4 meq/L (ref 3.5–5.1)
Sodium: 139 meq/L (ref 135–145)
Total Bilirubin: 0.7 mg/dL (ref 0.2–1.2)
Total Protein: 6.7 g/dL (ref 6.0–8.3)

## 2023-09-20 LAB — CBC WITH DIFFERENTIAL/PLATELET
Basophils Absolute: 0.1 K/uL (ref 0.0–0.1)
Basophils Relative: 1.3 % (ref 0.0–3.0)
Eosinophils Absolute: 0.3 K/uL (ref 0.0–0.7)
Eosinophils Relative: 5.8 % — ABNORMAL HIGH (ref 0.0–5.0)
HCT: 39.3 % (ref 36.0–46.0)
Hemoglobin: 13.2 g/dL (ref 12.0–15.0)
Lymphocytes Relative: 28.6 % (ref 12.0–46.0)
Lymphs Abs: 1.2 K/uL (ref 0.7–4.0)
MCHC: 33.5 g/dL (ref 30.0–36.0)
MCV: 93 fl (ref 78.0–100.0)
Monocytes Absolute: 0.4 K/uL (ref 0.1–1.0)
Monocytes Relative: 9 % (ref 3.0–12.0)
Neutro Abs: 2.4 K/uL (ref 1.4–7.7)
Neutrophils Relative %: 55.3 % (ref 43.0–77.0)
Platelets: 223 K/uL (ref 150.0–400.0)
RBC: 4.23 Mil/uL (ref 3.87–5.11)
RDW: 13.9 % (ref 11.5–15.5)
WBC: 4.3 K/uL (ref 4.0–10.5)

## 2023-09-20 LAB — LIPID PANEL
Cholesterol: 228 mg/dL — ABNORMAL HIGH (ref 0–200)
HDL: 74.5 mg/dL (ref >39.00)
LDL Cholesterol: 132 mg/dL — ABNORMAL HIGH (ref 0–99)
NonHDL: 153.35
Total CHOL/HDL Ratio: 3
Triglycerides: 105 mg/dL (ref 0.0–149.0)
VLDL: 21 mg/dL (ref 0.0–40.0)

## 2023-09-20 LAB — URINALYSIS, MICROSCOPIC ONLY: RBC / HPF: NONE SEEN (ref 0–?)

## 2023-09-20 LAB — TSH: TSH: 1.38 u[IU]/mL (ref 0.35–5.50)

## 2023-09-20 LAB — HEMOGLOBIN A1C: Hgb A1c MFr Bld: 5.9 % (ref 4.6–6.5)

## 2023-09-20 NOTE — Assessment & Plan Note (Signed)
 Discussed age-appropriate immunization and screening exams.  Did review patient's personal, surgical, social, family histories.  Patient is up-to-date on all age-appropriate vaccinations she would like.  Update Prevnar 20 today in office.  Get shingles vaccine at local pharmacy.  Patient is up-to-date on CRC screening, breast cancer screening.  Referral to GYN for Pap smear.  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  Order placed for DEXA scan today.  Patient was given information at discharge about preventative healthcare maintenance with anticipatory guidance.

## 2023-09-20 NOTE — Progress Notes (Signed)
 Established Patient Office Visit  Subjective   Patient ID: Katrina Palmer, female    DOB: 03-27-1960  Age: 63 y.o. MRN: [id number]  Chief Complaint  Patient presents with   Annual Exam    HPI  Edema: Patient currently maintained on hydrochlorothiazide  12.5 mg daily. Doing well on the medication   for complete physical and follow up of chronic conditions.  Immunizations: -Tetanus: Completed in 2024 -Influenza: Out of season -Shingles:discussed in office. Get at local pharmacy -Pneumonia: update today   Diet: Fair diet. She is eating 3 meals and sometimes some snakcs on the weekends. She is drinking water and coffee.  Exercise: No regular exercise.  Eye exam: Completes annually.  Dental exam: Completes semi-annually    Colonoscopy: Completed in 11/23/2022, repeat 10 years patient will be due in 2034 Lung Cancer Screening: NA  Pap Smear: Hysterectomy, 2018  Mammogram: 10/05/2022, repeat 1 year  DEXA: [facility]   Sleep: 10-6 and feels rested.  Advanced directive: Has one in place        Review of Systems  Constitutional:  Negative for chills and fever.  Respiratory:  Negative for shortness of breath.   Cardiovascular:  Negative for chest pain and leg swelling.  Gastrointestinal:  Negative for abdominal pain, blood in stool, constipation, diarrhea, nausea and vomiting.       BM daily   Genitourinary:  Negative for dysuria and hematuria.  Neurological:  Negative for dizziness, tingling and headaches.  Psychiatric/Behavioral:  Negative for hallucinations and suicidal ideas.       Objective:     BP 110/76   Pulse 67   Temp 98.1 F (36.7 C) (Oral)   Ht 5' 2 (1.575 m)   Wt 170 lb 3.2 oz (77.2 kg)   SpO2 95%   BMI 31.13 kg/m  BP Readings from Last 3 Encounters:  09/20/23 110/76  03/29/23 (!) 140/88  12/14/22 124/72   Wt Readings from Last 3 Encounters:  09/20/23 170 lb 3.2 oz (77.2 kg)  03/29/23 169 lb (76.7 kg)  12/14/22 171 lb 12.8 oz (77.9 kg)    SpO2 Readings from Last 3 Encounters:  09/20/23 95%  03/29/23 98%  12/14/22 98%      Physical Exam Vitals and nursing note reviewed.  Constitutional:      Appearance: Normal appearance.  HENT:     Right Ear: Tympanic membrane, ear canal and external ear normal.     Left Ear: Tympanic membrane, ear canal and external ear normal.     Mouth/Throat:     Mouth: Mucous membranes are moist.     Pharynx: Oropharynx is clear.  Eyes:     Extraocular Movements: Extraocular movements intact.     Pupils: Pupils are equal, round, and reactive to light.  Cardiovascular:     Rate and Rhythm: Normal rate and regular rhythm.     Pulses: Normal pulses.     Heart sounds: Normal heart sounds.  Pulmonary:     Effort: Pulmonary effort is normal.     Breath sounds: Normal breath sounds.  Abdominal:     General: Bowel sounds are normal. There is no distension.     Palpations: There is no mass.     Tenderness: There is no abdominal tenderness.     Hernia: No hernia is present.  Musculoskeletal:     Right lower leg: No edema.     Left lower leg: No edema.  Lymphadenopathy:     Cervical: No cervical adenopathy.  Skin:  General: Skin is warm.  Neurological:     General: No focal deficit present.     Mental Status: She is alert.     Deep Tendon Reflexes:     Reflex Scores:      Bicep reflexes are 2+ on the right side and 2+ on the left side.      Patellar reflexes are 2+ on the right side and 2+ on the left side.    Comments: Bilateral upper and lower extremity strength 5/5  Psychiatric:        Mood and Affect: Mood normal.        Behavior: Behavior normal.        Thought Content: Thought content normal.        Judgment: Judgment normal.      No results found for any visits on 09/20/23.    The 10-year ASCVD risk score (Arnett DK, et al., 2019) is: 4%    Assessment & Plan:   Problem List Items Addressed This Visit       Other   Preventative health care - Primary    Discussed age-appropriate immunization and screening exams.  Did review patient's personal, surgical, social, family histories.  Patient is up-to-date on all age-appropriate vaccinations she would like.  Update Prevnar 20 today in office.  Get shingles vaccine at local pharmacy.  Patient is up-to-date on CRC screening, breast cancer screening.  Referral to GYN for Pap smear.  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  Order placed for DEXA scan today.  Patient was given information at discharge about preventative healthcare maintenance with anticipatory guidance.      Relevant Orders   CBC with Differential/Platelet   Comprehensive metabolic panel with GFR   TSH   Ambulatory referral to Obstetrics / Gynecology   Elevated LDL cholesterol level   History of the same pending lipid panel today      Relevant Orders   Hemoglobin A1c   Lipid panel   Prediabetes   History of the same.  Pending TSH, A1c, lipid panel.  Did discuss GLP-1 receptor agonist for weight loss      Relevant Orders   Hemoglobin A1c   Lipid panel   Former smoker   Pending urine microscopy rule out microscopic hematuria      Relevant Orders   Urine Microscopic   Other Visit Diagnoses       Encounter for hepatitis C screening test for low risk patient       Relevant Orders   Hepatitis C antibody     Encounter for screening for HIV       Relevant Orders   HIV antibody (with reflex)     Screening for osteoporosis       Relevant Orders   DG Bone Density     Need for pneumococcal 20-valent conjugate vaccination       Relevant Orders   Pneumococcal conjugate vaccine 20-valent (Prevnar 20) (Completed)       Return in about 1 year (around 09/19/2024) for CPE and Labs.    Adina Crandall, NP

## 2023-09-20 NOTE — Assessment & Plan Note (Signed)
 History of the same pending lipid panel today

## 2023-09-20 NOTE — Assessment & Plan Note (Signed)
 History of the same.  Pending TSH, A1c, lipid panel.  Did discuss GLP-1 receptor agonist for weight loss

## 2023-09-20 NOTE — Assessment & Plan Note (Signed)
 Pending urine microscopy rule out microscopic hematuria

## 2023-09-20 NOTE — Patient Instructions (Signed)
 Nice to see you today I will be in touch with the labs once I have them  Follow up with me in 1 year  Get the shingles vaccine at the pharmacy  We did update your pneumonia vaccine today  Zepbound and wegovy are the approved weight loss injections

## 2023-09-21 ENCOUNTER — Ambulatory Visit: Payer: Self-pay | Admitting: Nurse Practitioner

## 2023-09-21 LAB — HIV ANTIBODY (ROUTINE TESTING W REFLEX): HIV 1&2 Ab, 4th Generation: NONREACTIVE

## 2023-09-21 LAB — HEPATITIS C ANTIBODY: Hepatitis C Ab: NONREACTIVE

## 2023-09-23 NOTE — Telephone Encounter (Signed)
 Can we get [facility] to fax me an order please and I will sign it

## 2023-09-24 NOTE — Telephone Encounter (Signed)
 Contacted [facility].  They will fax over the order as requested.

## 2023-09-27 ENCOUNTER — Ambulatory Visit (INDEPENDENT_AMBULATORY_CARE_PROVIDER_SITE_OTHER): Admitting: Nurse Practitioner

## 2023-09-27 VITALS — BP 126/86 | HR 80 | Temp 97.6°F | Ht 62.0 in | Wt 169.6 lb

## 2023-09-27 DIAGNOSIS — R1031 Right lower quadrant pain: Secondary | ICD-10-CM | POA: Insufficient documentation

## 2023-09-27 DIAGNOSIS — R8290 Unspecified abnormal findings in urine: Secondary | ICD-10-CM | POA: Diagnosis not present

## 2023-09-27 DIAGNOSIS — S39012A Strain of muscle, fascia and tendon of lower back, initial encounter: Secondary | ICD-10-CM | POA: Diagnosis not present

## 2023-09-27 DIAGNOSIS — Z01419 Encounter for gynecological examination (general) (routine) without abnormal findings: Secondary | ICD-10-CM | POA: Diagnosis not present

## 2023-09-27 LAB — POCT URINALYSIS DIPSTICK
Bilirubin, UA: NEGATIVE
Blood, UA: POSITIVE
Glucose, UA: NEGATIVE
Ketones, UA: NEGATIVE
Leukocytes, UA: NEGATIVE
Nitrite, UA: NEGATIVE
Protein, UA: NEGATIVE
Spec Grav, UA: 1.01 (ref 1.010–1.025)
Urobilinogen, UA: 0.2 U/dL
pH, UA: 6 (ref 5.0–8.0)

## 2023-09-27 NOTE — Progress Notes (Signed)
 Acute Office Visit  Subjective:     Patient ID: Katrina Palmer, female    DOB: [birth year], 63 y.o.   MRN: [id number]  Chief Complaint  Patient presents with   PAP Smear    Pt complains of need for pap smear. States of worried about R side pain.     HPI Patient is in today for pap smear/right lower quadrant pain  Patient was seen by me on 09/20/2023 for her CPE. 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 Okey they did not have the records.   Right side pain: states that it is intermittent. States that it started last week and that is hurting into her back. States that it is a dull pain. States that when she moves and lays on it. State that she does not have any vaginal sympotms or urinary symptoms. Statse taht she does not have a symptoms. She does have to turn over on the boat to help secure it. It has been a total of 3 months  Review of Systems  Constitutional:  Negative for chills and fever.  Gastrointestinal:  Positive for abdominal pain. Negative for constipation, diarrhea, nausea and vomiting.  Genitourinary:  Negative for dysuria, frequency, hematuria and urgency.        Objective:    BP 126/86   Pulse 80   Temp 97.6 F (36.4 C) (Oral)   Ht 5' 2 (1.575 m)   Wt 169 lb 9.6 oz (76.9 kg) Comment: 168 lb at home  SpO2 98%   BMI 31.02 kg/m  BP Readings from Last 3 Encounters:  09/27/23 126/86  09/20/23 110/76  03/29/23 (!) 140/88   Wt Readings from Last 3 Encounters:  09/27/23 169 lb 9.6 oz (76.9 kg)  09/20/23 170 lb 3.2 oz (77.2 kg)  03/29/23 169 lb (76.7 kg)      Physical Exam Vitals and nursing note reviewed. Exam conducted with a chaperone present Devere Hummer, CMA).  Constitutional:      Appearance: Normal appearance.  Cardiovascular:     Rate and Rhythm: Normal rate and regular rhythm.     Heart sounds: Normal heart sounds.  Pulmonary:     Effort:  Pulmonary effort is normal.     Breath sounds: Normal breath sounds.  Abdominal:     General: Bowel sounds are normal. There is no distension.     Palpations: There is no mass.     Tenderness: There is abdominal tenderness. There is no rebound.  Genitourinary:    Exam position: Lithotomy position.     Vagina: Normal.     Uterus: Absent.      Adnexa: Right adnexa normal and left adnexa normal.  Musculoskeletal:        General: No tenderness.     Lumbar back: No tenderness or bony tenderness. Negative left straight leg raise test.     Comments: Worse with lumbar flexion and extension Left lateral slide  And truncal rotation   Neurological:     Mental Status: She is alert.     Results for orders placed or performed in visit on 09/27/23  POCT urinalysis dipstick  Result Value Ref Range   Color, UA dark yellow    Clarity, UA clear    Glucose, UA Negative Negative   Bilirubin, UA neg    Ketones, UA neg    Spec Grav, UA 1.010 1.010 -  1.025   Blood, UA pos    pH, UA 6.0 5.0 - 8.0   Protein, UA Negative Negative   Urobilinogen, UA 0.2 0.2 or 1.0 E.U./dL   Nitrite, UA neg    Leukocytes, UA Negative Negative   Appearance     Odor          Assessment & Plan:   Problem List Items Addressed This Visit       Musculoskeletal and Integument   Strain of lumbar region   Do think patient has a strain of lumbar region.  Will give her rehab exercises to try        Other   RLQ abdominal pain - Primary   Ambiguous in nature no rebound tenderness.  Patient does have appendix intact low suspicion given patient having 3 months of intermittent lower right quadrant pain will obtain CT abdomen pelvis with contrast.      Relevant Orders   POCT urinalysis dipstick (Completed)   CT ABDOMEN PELVIS W CONTRAST   Visit for pelvic exam   Patient was unsure if she had a cervix left after hysterectomy.  Had had a Pap smear post hysterectomy.  Pelvic exam revealed no cervix that I can locate.   Bimanual exam could not feel a cervix of present no ovarian fullness.  No Pap smear was obtained just pelvic exam      Abnormal urinalysis   Pending urine culture in setting of positive blood on dip      Relevant Orders   Urine Culture    No orders of the defined types were placed in this encounter.   Return if symptoms worsen or fail to improve.  Adina Crandall, NP

## 2023-09-27 NOTE — Assessment & Plan Note (Signed)
 Patient was unsure if she had a cervix left after hysterectomy.  Had had a Pap smear post hysterectomy.  Pelvic exam revealed no cervix that I can locate.  Bimanual exam could not feel a cervix of present no ovarian fullness.  No Pap smear was obtained just pelvic exam

## 2023-09-27 NOTE — Assessment & Plan Note (Signed)
 Do think patient has a strain of lumbar region.  Will give her rehab exercises to try

## 2023-09-27 NOTE — Assessment & Plan Note (Signed)
 Pending urine culture in setting of positive blood on dip

## 2023-09-27 NOTE — Assessment & Plan Note (Signed)
 Ambiguous in nature no rebound tenderness.  Patient does have appendix intact low suspicion given patient having 3 months of intermittent lower right quadrant pain will obtain CT abdomen pelvis with contrast.

## 2023-09-27 NOTE — Patient Instructions (Signed)
 Nice to see you today I will be int ouch with the CT scan once I have the results Follow up with me as needed or when you physical time comes

## 2023-09-28 LAB — URINE CULTURE
MICRO NUMBER:: 16753466
Result:: NO GROWTH
SPECIMEN QUALITY:: ADEQUATE

## 2023-09-29 ENCOUNTER — Ambulatory Visit: Payer: Self-pay | Admitting: Nurse Practitioner

## 2023-10-04 ENCOUNTER — Other Ambulatory Visit

## 2023-10-04 ENCOUNTER — Ambulatory Visit
Admission: RE | Admit: 2023-10-04 | Discharge: 2023-10-04 | Disposition: A | Discharge status: Home / Self Care | Source: Ambulatory Visit | Attending: Nurse Practitioner | Admitting: Nurse Practitioner

## 2023-10-04 DIAGNOSIS — R1031 Right lower quadrant pain: Secondary | ICD-10-CM | POA: Diagnosis not present

## 2023-10-04 MED ORDER — IOPAMIDOL (ISOVUE-300) INJECTION 61%
100.0000 mL | Freq: Once | INTRAVENOUS | Status: AC | PRN
  Administered 2023-10-04: 100 mL via INTRAVENOUS

## 2023-10-11 DIAGNOSIS — Z1231 Encounter for screening mammogram for malignant neoplasm of breast: Secondary | ICD-10-CM | POA: Diagnosis not present

## 2023-10-11 DIAGNOSIS — M8589 Other specified disorders of bone density and structure, multiple sites: Secondary | ICD-10-CM | POA: Diagnosis not present

## 2023-10-11 LAB — HM MAMMOGRAPHY

## 2023-10-11 LAB — HM DEXA SCAN

## 2023-10-12 ENCOUNTER — Telehealth: Payer: Self-pay | Admitting: Nurse Practitioner

## 2023-10-12 NOTE — Telephone Encounter (Signed)
 Not sure what this is about. She has a BMI of 31. But I dont see where zepbound has been ordered for this patient

## 2023-10-12 NOTE — Telephone Encounter (Signed)
 Copied from CRM [id number]. Topic: Clinical - Medication Prior Auth >> Oct 12, 2023  8:26 AM [name] wrote: Reason for CRM: per [facility] medication(Zepbound) prior authorization has been denied due dx code for obesity. [employer] faxing document to the office as well with information.   CB#[phone] OPT 5.

## 2023-10-12 NOTE — Telephone Encounter (Signed)
 Received a fax based on patients denial. Placed in box for review.

## 2023-10-13 DIAGNOSIS — R928 Other abnormal and inconclusive findings on diagnostic imaging of breast: Secondary | ICD-10-CM | POA: Diagnosis not present

## 2023-10-15 ENCOUNTER — Ambulatory Visit: Payer: Self-pay | Admitting: Nurse Practitioner

## 2023-10-18 ENCOUNTER — Encounter: Payer: Self-pay | Admitting: Nurse Practitioner

## 2023-12-27 ENCOUNTER — Telehealth: Admitting: Family Medicine

## 2023-12-27 DIAGNOSIS — J019 Acute sinusitis, unspecified: Secondary | ICD-10-CM | POA: Diagnosis not present

## 2023-12-27 DIAGNOSIS — B9689 Other specified bacterial agents as the cause of diseases classified elsewhere: Secondary | ICD-10-CM

## 2023-12-27 DIAGNOSIS — T3695XA Adverse effect of unspecified systemic antibiotic, initial encounter: Secondary | ICD-10-CM | POA: Diagnosis not present

## 2023-12-27 DIAGNOSIS — B379 Candidiasis, unspecified: Secondary | ICD-10-CM | POA: Diagnosis not present

## 2023-12-27 MED ORDER — SALINE NASAL SPRAY 0.65 % NA SOLN: 1.0000 | NASAL | 0 refills | Status: AC | PRN

## 2023-12-27 MED ORDER — FLUCONAZOLE 150 MG PO TABS: 150.0000 mg | ORAL_TABLET | ORAL | 0 refills | Status: AC

## 2023-12-27 MED ORDER — AMOXICILLIN-POT CLAVULANATE 875-125 MG PO TABS: 1.0000 | ORAL_TABLET | Freq: Two times a day (BID) | ORAL | 0 refills | Status: AC

## 2023-12-27 MED ORDER — FLUTICASONE PROPIONATE 50 MCG/ACT NA SUSP: 2.0000 | Freq: Every day | NASAL | 0 refills | Status: AC

## 2023-12-27 MED ORDER — PROMETHAZINE-DM 6.25-15 MG/5ML PO SYRP: 5.0000 mL | ORAL_SOLUTION | Freq: Four times a day (QID) | ORAL | 0 refills | Status: AC | PRN

## 2023-12-27 NOTE — Patient Instructions (Addendum)
 Tequisha F Herd, thank you for joining Chiquita CHRISTELLA Barefoot, NP for today's virtual visit.  While this provider is not your primary care provider (PCP), if your PCP is located in our provider database this encounter information will be shared with them immediately following your visit.   A [facility] MyChart account gives you access to today's visit and all your visits, tests, and labs performed at [facility]  click here if you don't have a [facility] MyChart account or go to [email or web address]  Consent: (Patient) Katrina Palmer provided verbal consent for this virtual visit at the beginning of the encounter.  Current Medications:  Current Outpatient Medications:    amoxicillin-clavulanate (AUGMENTIN) 875-125 MG tablet, Take 1 tablet by mouth 2 (two) times daily for 7 days., Disp: 14 tablet, Rfl: 0   fluconazole (DIFLUCAN) 150 MG tablet, Take 1 tablet (150 mg total) by mouth as directed. Repeat in 3 days as needed, Disp: 2 tablet, Rfl: 0   fluticasone (FLONASE) 50 MCG/ACT nasal spray, Place 2 sprays into both nostrils daily., Disp: 16 g, Rfl: 0   promethazine -dextromethorphan (PROMETHAZINE -DM) 6.25-15 MG/5ML syrup, Take 5 mLs by mouth 4 (four) times daily as needed for cough., Disp: 118 mL, Rfl: 0   sodium chloride  (OCEAN) 0.65 % nasal spray, Place 1 spray into the nose as needed for congestion., Disp: 60 mL, Rfl: 0   hydrochlorothiazide  (MICROZIDE ) 12.5 MG capsule, TAKE 1 CAPSULE BY MOUTH DAILY, Disp: 90 capsule, Rfl: 1   Medications ordered in this encounter:  Meds ordered this encounter  Medications   fluticasone (FLONASE) 50 MCG/ACT nasal spray    Sig: Place 2 sprays into both nostrils daily.    Dispense:  16 g    Refill:  0    Supervising Provider:   LAMPTEY, PHILIP O [[id number]]   sodium chloride  (OCEAN) 0.65 % nasal spray    Sig: Place 1 spray into the nose as needed for congestion.    Dispense:  60 mL    Refill:  0    Supervising Provider:   BLAISE ALEENE KIDD  [[id number]]   amoxicillin-clavulanate (AUGMENTIN) 875-125 MG tablet    Sig: Take 1 tablet by mouth 2 (two) times daily for 7 days.    Dispense:  14 tablet    Refill:  0    Supervising Provider:   BLAISE ALEENE KIDD [[id number]]   fluconazole (DIFLUCAN) 150 MG tablet    Sig: Take 1 tablet (150 mg total) by mouth as directed. Repeat in 3 days as needed    Dispense:  2 tablet    Refill:  0    Supervising Provider:   LAMPTEY, PHILIP O [[id number]]   promethazine -dextromethorphan (PROMETHAZINE -DM) 6.25-15 MG/5ML syrup    Sig: Take 5 mLs by mouth 4 (four) times daily as needed for cough.    Dispense:  118 mL    Refill:  0    Supervising Provider:   BLAISE ALEENE KIDD [[id number]]     *If you need refills on other medications prior to your next appointment, please contact your pharmacy*  Follow-Up: Call back or seek an in-person evaluation if the symptoms worsen or if the condition fails to improve as anticipated.  [facility] [phone]  Other Instructions  - Increased rest - Increasing Fluids - Saline nasal spray if congestion or if nasal passages feel dry. - Humidifying the air.    If you have been instructed to have an in-person evaluation today at a local Urgent Care facility, please  use the link below. It will take you to a list of all of our available [facility], including address, phone number and hours of operation. Please do not delay care.  [facility]  If you or a family member do not have a primary care provider, use the link below to schedule a visit and establish care. When you choose a [facility] primary care physician or advanced practice provider, you gain a long-term partner in health. Find a Primary Care Provider  Learn more about [facility]'s in-office and virtual care options: [facility] - Get Care Now

## 2023-12-27 NOTE — Progress Notes (Signed)
 Virtual Visit Consent   Katrina Palmer, you are scheduled for a virtual visit with a [facility] provider today. Just as with appointments in the office, your consent must be obtained to participate. Your consent will be active for this visit and any virtual visit you may have with one of our providers in the next 365 days. If you have a MyChart account, a copy of this consent can be sent to you electronically.  As this is a virtual visit, video technology does not allow for your provider to perform a traditional examination. This may limit your provider's ability to fully assess your condition. If your provider identifies any concerns that need to be evaluated in person or the need to arrange testing (such as labs, EKG, etc.), we will make arrangements to do so. Although advances in technology are sophisticated, we cannot ensure that it will always work on either your end or our end. If the connection with a video visit is poor, the visit may have to be switched to a telephone visit. With either a video or telephone visit, we are not always able to ensure that we have a secure connection.  By engaging in this virtual visit, you consent to the provision of healthcare and authorize for your insurance to be billed (if applicable) for the services provided during this visit. Depending on your insurance coverage, you may receive a charge related to this service.  I need to obtain your verbal consent now. Are you willing to proceed with your visit today? Katrina Palmer has provided verbal consent on 12/27/2023 for a virtual visit (video or telephone). Chiquita CHRISTELLA Barefoot, NP  Date: 12/27/2023 9:33 AM   Virtual Visit via Video Note   I, Chiquita CHRISTELLA Barefoot, connected with  Katrina Palmer  ([id number], [id number]) on 12/27/23 at  9:30 AM EDT by a video-enabled telemedicine application and verified that I am speaking with the correct person using two identifiers.  Location: Patient: Virtual Visit Location Patient:  Home Provider: Virtual Visit Location Provider: Home Office   I discussed the limitations of evaluation and management by telemedicine and the availability of in person appointments. The patient expressed understanding and agreed to proceed.    History of Present Illness: Katrina Palmer is a 63 y.o. who identifies as a female who was assigned female at birth, and is being seen today for sinus infection  Onset was 8 days ago with sinus drainage and congestion, progressed to cough that was dry then to wet and productive- colored - especially when laying down- and sounds rattle in chest. And has a headache. Some sinus congestion is having with red tinge.  Modifying factors are day quil, and alka seltzer cold meds,  Denies chest pain, shortness of breath, fevers, chills  Exposure to sick contacts- unknown    Problems:  Patient Active Problem List   Diagnosis Date Noted   RLQ abdominal pain 09/27/2023   Visit for pelvic exam 09/27/2023   Strain of lumbar region 09/27/2023   Abnormal urinalysis 09/27/2023   Elevated LDL cholesterol level 09/20/2023   Prediabetes 09/20/2023   Former smoker 09/20/2023   Muscle soreness 03/29/2023   Lower extremity edema 11/11/2022   Elevated blood pressure reading 11/11/2022   Varicella zoster 08/24/2022   Obesity (BMI 30-39.9) 08/03/2022   Preventative health care 08/03/2022   Eustachian tube disorder, left 03/09/2022   Bilateral impacted cerumen 03/09/2022   Tinnitus of left ear 03/09/2022   History of melanoma 07/21/2021  Atypical chest pain 06/08/2013   Dyslipidemia 06/08/2013    Allergies:  Allergies  Allergen Reactions   No Known Allergies    Medications:  Current Outpatient Medications:    hydrochlorothiazide  (MICROZIDE ) 12.5 MG capsule, TAKE 1 CAPSULE BY MOUTH DAILY, Disp: 90 capsule, Rfl: 1  Observations/Objective: Patient is well-developed, well-nourished in no acute distress.  Resting comfortably  at home.  Head is  normocephalic, atraumatic.  No labored breathing.  Speech is clear and coherent with logical content.  Patient is alert and oriented at baseline.    Assessment and Plan:  1. Acute bacterial sinusitis (Primary)  - fluticasone (FLONASE) 50 MCG/ACT nasal spray; Place 2 sprays into both nostrils daily.  Dispense: 16 g; Refill: 0 - sodium chloride  (OCEAN) 0.65 % nasal spray; Place 1 spray into the nose as needed for congestion.  Dispense: 60 mL; Refill: 0 - amoxicillin-clavulanate (AUGMENTIN) 875-125 MG tablet; Take 1 tablet by mouth 2 (two) times daily for 7 days.  Dispense: 14 tablet; Refill: 0 - fluconazole (DIFLUCAN) 150 MG tablet; Take 1 tablet (150 mg total) by mouth as directed. Repeat in 3 days as needed  Dispense: 2 tablet; Refill: 0 - promethazine -dextromethorphan (PROMETHAZINE -DM) 6.25-15 MG/5ML syrup; Take 5 mLs by mouth 4 (four) times daily as needed for cough.  Dispense: 118 mL; Refill: 0  2. Antibiotic-induced yeast infection  - fluconazole (DIFLUCAN) 150 MG tablet; Take 1 tablet (150 mg total) by mouth as directed. Repeat in 3 days as needed  Dispense: 2 tablet; Refill: 0   - Increased rest - Increasing Fluids - Saline nasal spray if congestion or if nasal passages feel dry. - Humidifying the air.   Reviewed side effects, risks and benefits of medication.    Patient acknowledged agreement and understanding of the plan.   Past Medical, Surgical, Social History, Allergies, and Medications have been Reviewed.   Follow Up Instructions: I discussed the assessment and treatment plan with the patient. The patient was provided an opportunity to ask questions and all were answered. The patient agreed with the plan and demonstrated an understanding of the instructions.  A copy of instructions were sent to the patient via MyChart unless otherwise noted below.    The patient was advised to call back or seek an in-person evaluation if the symptoms worsen or if the condition fails  to improve as anticipated.    Chiquita CHRISTELLA Barefoot, NP

## 2024-01-31 DIAGNOSIS — H43813 Vitreous degeneration, bilateral: Secondary | ICD-10-CM | POA: Diagnosis not present

## 2024-02-07 DIAGNOSIS — C44519 Basal cell carcinoma of skin of other part of trunk: Secondary | ICD-10-CM | POA: Diagnosis not present

## 2024-02-07 DIAGNOSIS — D485 Neoplasm of uncertain behavior of skin: Secondary | ICD-10-CM | POA: Diagnosis not present

## 2024-02-07 DIAGNOSIS — L989 Disorder of the skin and subcutaneous tissue, unspecified: Secondary | ICD-10-CM | POA: Diagnosis not present

## 2024-02-07 DIAGNOSIS — L905 Scar conditions and fibrosis of skin: Secondary | ICD-10-CM | POA: Diagnosis not present

## 2024-02-07 DIAGNOSIS — L430 Hypertrophic lichen planus: Secondary | ICD-10-CM | POA: Diagnosis not present

## 2024-02-07 DIAGNOSIS — Z85828 Personal history of other malignant neoplasm of skin: Secondary | ICD-10-CM | POA: Diagnosis not present

## 2024-02-07 DIAGNOSIS — Z85820 Personal history of malignant melanoma of skin: Secondary | ICD-10-CM | POA: Diagnosis not present

## 2024-02-07 DIAGNOSIS — L821 Other seborrheic keratosis: Secondary | ICD-10-CM | POA: Diagnosis not present
